# Patient Record
Sex: Female | Born: 1955 | Race: White | Hispanic: No | Smoking: Current every day smoker
Health system: Southern US, Community
[De-identification: ages and names within clinical notes are randomized; demographics above are authoritative.]

## PROBLEM LIST (undated history)

## (undated) DIAGNOSIS — R87623 High grade squamous intraepithelial lesion on cytologic smear of vagina (HGSIL): Secondary | ICD-10-CM

## (undated) DIAGNOSIS — G8929 Other chronic pain: Secondary | ICD-10-CM

## (undated) DIAGNOSIS — I10 Essential (primary) hypertension: Secondary | ICD-10-CM

## (undated) DIAGNOSIS — K219 Gastro-esophageal reflux disease without esophagitis: Secondary | ICD-10-CM

## (undated) DIAGNOSIS — F419 Anxiety disorder, unspecified: Secondary | ICD-10-CM

## (undated) DIAGNOSIS — M169 Osteoarthritis of hip, unspecified: Secondary | ICD-10-CM

## (undated) DIAGNOSIS — Z972 Presence of dental prosthetic device (complete) (partial): Secondary | ICD-10-CM

## (undated) DIAGNOSIS — M549 Dorsalgia, unspecified: Secondary | ICD-10-CM

## (undated) DIAGNOSIS — R002 Palpitations: Secondary | ICD-10-CM

---

## 1984-07-03 HISTORY — PX: CHOLECYSTECTOMY: SHX55

## 1998-05-31 ENCOUNTER — Ambulatory Visit (HOSPITAL_COMMUNITY): Admission: RE | Admit: 1998-05-31 | Discharge: 1998-05-31 | Payer: Self-pay | Admitting: Family Medicine

## 2000-06-08 ENCOUNTER — Ambulatory Visit (HOSPITAL_COMMUNITY): Admission: RE | Admit: 2000-06-08 | Discharge: 2000-06-08 | Payer: Self-pay | Admitting: Family Medicine

## 2000-06-08 ENCOUNTER — Encounter: Payer: Self-pay | Admitting: Family Medicine

## 2001-07-03 HISTORY — PX: FOOT SURGERY: SHX648

## 2002-12-10 ENCOUNTER — Other Ambulatory Visit: Admission: RE | Admit: 2002-12-10 | Discharge: 2002-12-10 | Payer: Self-pay | Admitting: Obstetrics and Gynecology

## 2003-10-22 ENCOUNTER — Ambulatory Visit (HOSPITAL_COMMUNITY): Admission: RE | Admit: 2003-10-22 | Discharge: 2003-10-22 | Payer: Self-pay | Admitting: Cardiology

## 2003-12-24 ENCOUNTER — Encounter: Admission: RE | Admit: 2003-12-24 | Discharge: 2003-12-24 | Payer: Self-pay | Admitting: Cardiology

## 2004-05-02 ENCOUNTER — Other Ambulatory Visit: Admission: RE | Admit: 2004-05-02 | Discharge: 2004-05-02 | Payer: Self-pay | Admitting: Obstetrics and Gynecology

## 2009-11-17 ENCOUNTER — Ambulatory Visit (HOSPITAL_COMMUNITY): Admission: RE | Admit: 2009-11-17 | Discharge: 2009-11-17 | Payer: Self-pay | Admitting: Obstetrics & Gynecology

## 2010-07-23 ENCOUNTER — Encounter: Payer: Self-pay | Admitting: Obstetrics and Gynecology

## 2010-07-24 ENCOUNTER — Encounter: Payer: Self-pay | Admitting: Cardiology

## 2015-04-02 ENCOUNTER — Other Ambulatory Visit: Payer: Self-pay | Admitting: Specialist

## 2015-04-02 DIAGNOSIS — Z1231 Encounter for screening mammogram for malignant neoplasm of breast: Secondary | ICD-10-CM

## 2015-07-14 ENCOUNTER — Other Ambulatory Visit: Payer: Self-pay | Admitting: Orthopedic Surgery

## 2015-07-14 DIAGNOSIS — M1611 Unilateral primary osteoarthritis, right hip: Secondary | ICD-10-CM

## 2015-07-16 ENCOUNTER — Ambulatory Visit
Admission: RE | Admit: 2015-07-16 | Discharge: 2015-07-16 | Disposition: A | Discharge status: Home / Self Care | Payer: No Typology Code available for payment source | Source: Ambulatory Visit | Attending: Orthopedic Surgery | Admitting: Orthopedic Surgery

## 2015-07-16 DIAGNOSIS — M1611 Unilateral primary osteoarthritis, right hip: Secondary | ICD-10-CM

## 2015-07-16 MED ORDER — IOHEXOL 180 MG/ML  SOLN
1.0000 mL | Freq: Once | INTRAMUSCULAR | Status: AC | PRN
Start: 2015-07-16 — End: 2015-07-16
  Administered 2015-07-16: 1 mL via INTRA_ARTICULAR

## 2015-07-16 MED ORDER — METHYLPREDNISOLONE ACETATE 40 MG/ML INJ SUSP (RADIOLOG
120.0000 mg | Freq: Once | INTRAMUSCULAR | Status: AC
  Administered 2015-07-16: 120 mg via INTRA_ARTICULAR

## 2015-07-21 ENCOUNTER — Ambulatory Visit
Admission: RE | Admit: 2015-07-21 | Discharge: 2015-07-21 | Disposition: A | Discharge status: Home / Self Care | Payer: No Typology Code available for payment source | Source: Ambulatory Visit | Attending: Specialist | Admitting: Specialist

## 2015-07-21 DIAGNOSIS — Z1231 Encounter for screening mammogram for malignant neoplasm of breast: Secondary | ICD-10-CM

## 2015-08-23 ENCOUNTER — Ambulatory Visit: Payer: Self-pay | Admitting: Podiatry

## 2016-01-06 ENCOUNTER — Other Ambulatory Visit: Payer: Self-pay | Admitting: Orthopedic Surgery

## 2016-01-06 DIAGNOSIS — M1611 Unilateral primary osteoarthritis, right hip: Secondary | ICD-10-CM

## 2016-01-17 ENCOUNTER — Ambulatory Visit
Admission: RE | Admit: 2016-01-17 | Discharge: 2016-01-17 | Disposition: A | Discharge status: Home / Self Care | Payer: No Typology Code available for payment source | Source: Ambulatory Visit | Attending: Orthopedic Surgery | Admitting: Orthopedic Surgery

## 2016-01-17 DIAGNOSIS — M1611 Unilateral primary osteoarthritis, right hip: Secondary | ICD-10-CM

## 2016-01-17 MED ORDER — METHYLPREDNISOLONE ACETATE 40 MG/ML INJ SUSP (RADIOLOG
120.0000 mg | Freq: Once | INTRAMUSCULAR | Status: AC
  Administered 2016-01-17: 120 mg via INTRA_ARTICULAR

## 2016-01-17 MED ORDER — IOPAMIDOL (ISOVUE-M 200) INJECTION 41%
1.0000 mL | Freq: Once | INTRAMUSCULAR | Status: AC
  Administered 2016-01-17: 1 mL via INTRA_ARTICULAR

## 2016-10-27 ENCOUNTER — Other Ambulatory Visit: Payer: Self-pay | Admitting: Obstetrics and Gynecology

## 2016-10-27 DIAGNOSIS — Z1231 Encounter for screening mammogram for malignant neoplasm of breast: Secondary | ICD-10-CM

## 2016-11-16 ENCOUNTER — Ambulatory Visit (HOSPITAL_COMMUNITY): Payer: Self-pay

## 2016-11-21 ENCOUNTER — Ambulatory Visit (HOSPITAL_COMMUNITY): Payer: Self-pay

## 2016-11-30 ENCOUNTER — Ambulatory Visit (HOSPITAL_COMMUNITY): Payer: Self-pay

## 2016-12-18 ENCOUNTER — Ambulatory Visit (INDEPENDENT_AMBULATORY_CARE_PROVIDER_SITE_OTHER): Payer: Self-pay | Admitting: Orthopaedic Surgery

## 2016-12-28 ENCOUNTER — Ambulatory Visit (INDEPENDENT_AMBULATORY_CARE_PROVIDER_SITE_OTHER): Payer: Self-pay | Admitting: Orthopaedic Surgery

## 2016-12-28 ENCOUNTER — Ambulatory Visit (INDEPENDENT_AMBULATORY_CARE_PROVIDER_SITE_OTHER): Payer: Self-pay

## 2016-12-28 DIAGNOSIS — M1611 Unilateral primary osteoarthritis, right hip: Secondary | ICD-10-CM

## 2016-12-28 DIAGNOSIS — M25551 Pain in right hip: Secondary | ICD-10-CM | POA: Insufficient documentation

## 2016-12-28 NOTE — Progress Notes (Signed)
Office Visit Note   Patient: Valerie MerinoJoiann Laszlo           Date of Birth: 1956-07-03           MRN: [id number] Visit Date: 12/28/2016              Requested by: No referring provider defined for this encounter. PCP: Patient, No Pcp Per   Assessment & Plan: Visit Diagnoses:  1. Pain in right hip   2. Unilateral primary osteoarthritis, right hip     Plan: At this point I do feel that she would benefit from a right direct anterior replacement. She understands this will be significantly difficult due to her size. There is significant risk of soft tissue issues and infection as well as malpositioning of the implants given her weight. However I do feel that the surgery can be done. She is not a diabetic and not a smoker. She's highly motivated. Her pain is so severe that I feel that there is not really other options other than hip replacement surgery. I spent a considerable amount of time showing her a hip model and explained in detail with the surgery involves including a thorough discussion of the risks and benefits of surgery. We will work on getting this set up for the near future. All questions were encouraged and answered.  Follow-Up Instructions: Return for 2 weeks post-op.   Orders:  No orders of the defined types were placed in this encounter.  No orders of the defined types were placed in this encounter.     Procedures: No procedures performed   Clinical Data: No additional findings.   Subjective: No chief complaint on file. The patient is someone that Dr. Carola FrostHandy is sent to me to evaluate severe arthritis of her right hip. I've actually talk to Dr. Carola FrostHandy personally about her. She has known severe arthritis of right hip. Her pain is daily and it is 10 out of 10. She is morbidly obese individual and does walk with a cane. She is tried and failed all forms of conservative treatment including rest, ice, heat, anti-inflammatories, activity modification, multiple steroid injections in  her right hip and attempts at weight loss. Her pain is daily and is detrimentally affected her activities daily living, her quality of life, and her mobility. She's been seeking treatment for this hip for well over a year to 2 years now. Is been getting significantly worse. She does wish to consider hip replacement surgery and is here to talk about this today. Her pain is mainly in the groin. It does affect her right knee some as well. She is not a smoker and not a diabetic.  HPI  Review of Systems  She currently denies any headache, chest pain, shorter breath, fever, chills, nausea, vomiting.  Objective: Vital Signs: There were no vitals taken for this visit.  Physical Exam She is alert or 3 in no acute distress but obvious discomfort and [place] is slowly with a cane. She has a hard time getting up on the exam table due to her pain and obesity. Ortho Exam Examination of her hip on the right side shows severe pain with any attempts of internal and external rotation. Does radiate down to the knee. It's mainly in the groin. I had her lay in the supine position only exam table to get a good idea of what her soft tissue envelope is right in her groin and her thigh to determine if I could do this surgery safely through direct  anterior approach. Specialty Comments:  No specialty comments available.  Imaging: No results found. X-rays that accompany her of her right hip and pelvis show severe end-stage arthritis of the right hip. There is complete loss of the joint space. The hip ball is flattening and there is particular osteophytes and sclerotic changes.  PMFS History: There are no active problems to display for this patient.  No past medical history on file.  No family history on file.  No past surgical history on file. Social History   Occupational History  . Not on file.   Social History Main Topics  . Smoking status: Not on file  . Smokeless tobacco: Not on file  . Alcohol use  Not on file  . Drug use: Unknown  . Sexual activity: Not on file

## 2017-01-04 ENCOUNTER — Ambulatory Visit (HOSPITAL_COMMUNITY): Payer: Self-pay

## 2017-01-22 NOTE — Progress Notes (Signed)
Please place orders in EPIC as patient is being scheduled for a pre-op appointment! Thank you! 

## 2017-02-16 ENCOUNTER — Inpatient Hospital Stay: Admit: 2017-02-16 | Payer: Self-pay | Admitting: Orthopaedic Surgery

## 2017-02-16 SURGERY — ARTHROPLASTY, HIP, TOTAL, ANTERIOR APPROACH
Anesthesia: Choice | Site: Hip | Laterality: Right

## 2017-04-02 ENCOUNTER — Encounter (HOSPITAL_COMMUNITY): Payer: Self-pay | Admitting: *Deleted

## 2017-04-02 NOTE — Progress Notes (Signed)
Advised patient if misses this [facility] appointment would no longer qualify for our program. Patient voiced understanding.

## 2017-05-03 ENCOUNTER — Ambulatory Visit (HOSPITAL_COMMUNITY): Payer: Self-pay

## 2017-06-21 ENCOUNTER — Ambulatory Visit (HOSPITAL_COMMUNITY): Payer: Self-pay

## 2017-08-20 ENCOUNTER — Ambulatory Visit (INDEPENDENT_AMBULATORY_CARE_PROVIDER_SITE_OTHER): Payer: Self-pay | Admitting: Orthopaedic Surgery

## 2017-08-27 ENCOUNTER — Ambulatory Visit (INDEPENDENT_AMBULATORY_CARE_PROVIDER_SITE_OTHER): Payer: Self-pay | Admitting: Orthopaedic Surgery

## 2017-09-05 ENCOUNTER — Ambulatory Visit (INDEPENDENT_AMBULATORY_CARE_PROVIDER_SITE_OTHER): Payer: Medicaid Other | Admitting: Orthopaedic Surgery

## 2017-09-05 ENCOUNTER — Encounter (INDEPENDENT_AMBULATORY_CARE_PROVIDER_SITE_OTHER): Payer: Self-pay | Admitting: Orthopaedic Surgery

## 2017-09-05 DIAGNOSIS — M1611 Unilateral primary osteoarthritis, right hip: Secondary | ICD-10-CM

## 2017-09-05 NOTE — Progress Notes (Signed)
The patient comes in today for continued discussions of her upcoming right total hip arthroplasty.  She is someone who is significantly morbidly obese and a smoker but not a diabetic.  She has known severe degenerative changes in her hip and we have her set up for surgery on March 29.  We had a long and thorough discussion about this and she understands that she is at high risk for wound complications and infection given her obesity.  All questions concerns were answered and addressed.  Her hip is limited mobility due to the severity of her pain on my exam.  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  All questions and concerns were answered and addressed.  We will see her at the time of surgery on March 29.

## 2017-09-10 ENCOUNTER — Other Ambulatory Visit (INDEPENDENT_AMBULATORY_CARE_PROVIDER_SITE_OTHER): Payer: Self-pay

## 2017-09-18 NOTE — Progress Notes (Signed)
Surgery on 3/29.  Preop on 3/22.  Need orders in epic

## 2017-09-19 ENCOUNTER — Other Ambulatory Visit (INDEPENDENT_AMBULATORY_CARE_PROVIDER_SITE_OTHER): Payer: Self-pay | Admitting: Physician Assistant

## 2017-09-20 ENCOUNTER — Encounter (HOSPITAL_COMMUNITY): Payer: Self-pay

## 2017-09-20 ENCOUNTER — Ambulatory Visit: Payer: Medicaid Other | Admitting: Podiatry

## 2017-09-21 ENCOUNTER — Inpatient Hospital Stay (HOSPITAL_COMMUNITY)
Admission: RE | Admit: 2017-09-21 | Discharge: 2017-09-21 | Disposition: A | Discharge status: Home / Self Care | Payer: Medicaid Other | Source: Ambulatory Visit

## 2017-09-21 HISTORY — DX: Osteoarthritis of hip, unspecified: M16.9

## 2017-09-21 HISTORY — DX: Morbid (severe) obesity due to excess calories: E66.01

## 2017-09-21 HISTORY — DX: Dorsalgia, unspecified: M54.9

## 2017-09-21 HISTORY — DX: Essential (primary) hypertension: I10

## 2017-09-21 HISTORY — DX: High grade squamous intraepithelial lesion on cytologic smear of vagina (HGSIL): R87.623

## 2017-09-21 HISTORY — DX: Other chronic pain: G89.29

## 2017-09-24 ENCOUNTER — Other Ambulatory Visit (HOSPITAL_COMMUNITY): Payer: Self-pay | Admitting: Emergency Medicine

## 2017-09-24 ENCOUNTER — Other Ambulatory Visit: Payer: Self-pay

## 2017-09-24 ENCOUNTER — Encounter (HOSPITAL_COMMUNITY)
Admission: RE | Admit: 2017-09-24 | Discharge: 2017-09-24 | Disposition: A | Discharge status: Home / Self Care | Payer: Medicaid Other | Source: Ambulatory Visit | Attending: Orthopaedic Surgery | Admitting: Orthopaedic Surgery

## 2017-09-24 ENCOUNTER — Encounter (HOSPITAL_COMMUNITY): Payer: Self-pay

## 2017-09-24 DIAGNOSIS — Z01810 Encounter for preprocedural cardiovascular examination: Secondary | ICD-10-CM | POA: Insufficient documentation

## 2017-09-24 DIAGNOSIS — I10 Essential (primary) hypertension: Secondary | ICD-10-CM | POA: Diagnosis not present

## 2017-09-24 DIAGNOSIS — M1611 Unilateral primary osteoarthritis, right hip: Secondary | ICD-10-CM | POA: Insufficient documentation

## 2017-09-24 DIAGNOSIS — Z01818 Encounter for other preprocedural examination: Secondary | ICD-10-CM | POA: Insufficient documentation

## 2017-09-24 HISTORY — DX: Anxiety disorder, unspecified: F41.9

## 2017-09-24 HISTORY — DX: Gastro-esophageal reflux disease without esophagitis: K21.9

## 2017-09-24 HISTORY — DX: Presence of dental prosthetic device (complete) (partial): Z97.2

## 2017-09-24 HISTORY — DX: Palpitations: R00.2

## 2017-09-24 LAB — BASIC METABOLIC PANEL
ANION GAP: 10 (ref 5–15)
BUN: 13 mg/dL (ref 6–20)
CHLORIDE: 101 mmol/L (ref 101–111)
CO2: 29 mmol/L (ref 22–32)
Calcium: 9.7 mg/dL (ref 8.9–10.3)
Creatinine, Ser: 0.73 mg/dL (ref 0.44–1.00)
GFR calc Af Amer: >60 mL/min (ref >60)
GFR calc non Af Amer: >60 mL/min (ref >60)
Glucose, Bld: 105 mg/dL — ABNORMAL HIGH (ref 65–99)
POTASSIUM: 4.2 mmol/L (ref 3.5–5.1)
Sodium: 140 mmol/L (ref 135–145)

## 2017-09-24 LAB — CBC
HEMATOCRIT: 42.8 % (ref 36.0–46.0)
HEMOGLOBIN: 14.5 g/dL (ref 12.0–15.0)
MCH: 29.5 pg (ref 26.0–34.0)
MCHC: 33.9 g/dL (ref 30.0–36.0)
MCV: 87.2 fL (ref 78.0–100.0)
Platelets: 226 10*3/uL (ref 150–400)
RBC: 4.91 MIL/uL (ref 3.87–5.11)
RDW: 13.2 % (ref 11.5–15.5)
WBC: 7 10*3/uL (ref 4.0–10.5)

## 2017-09-24 LAB — SURGICAL PCR SCREEN
MRSA, PCR: NEGATIVE
STAPHYLOCOCCUS AUREUS: NEGATIVE

## 2017-09-24 LAB — ABO/RH: ABO/RH(D): O POS

## 2017-09-24 NOTE — Patient Instructions (Addendum)
Valerie MerinoJoiann Barton  09/24/2017   Your procedure is scheduled on: 09-28-17    [facility]  [facility]    Report to admitting at 12:30PM    Call this number if you have problems the morning of surgery [phone]     Remember: Do not eat food After Midnight. You may have clear liquids from midnight until 9am day of surgery. Nothing by mouth after 9am!     Take these medicines the morning of surgery with A SIP OF WATER: hydrocodone if needed, nasal spray if needed                                You may not have any metal on your body including hair pins and              piercings  Do not wear jewelry, make-up, lotions, powders or perfumes, deodorant             Do not wear nail polish.  Do not shave  48 hours prior to surgery.                Do not bring valuables to the hospital. [facility] IS NOT             RESPONSIBLE   FOR VALUABLES.  Contacts, dentures or bridgework may not be worn into surgery.  Leave suitcase in the car. After surgery it may be brought to your room.                Please read over the following fact sheets you were given: _____________________________________________________________________             CLEAR LIQUID DIET   Foods Allowed                                                                     Foods Excluded  Coffee and tea, regular and decaf                             liquids that you cannot  Plain Jell-O in any flavor                                             see through such as: Fruit ices (not with fruit pulp)                                     milk, soups, orange juice  Iced Popsicles                                    All solid food Carbonated beverages, regular and diet  Cranberry, grape and apple juices Sports drinks like Gatorade Lightly seasoned clear broth or consume(fat free) Sugar, honey syrup  Sample Menu Breakfast                                Lunch                                      Supper Cranberry juice                    Beef broth                            Chicken broth Jell-O                                     Grape juice                           Apple juice Coffee or tea                        Jell-O                                      Popsicle                                                Coffee or tea                        Coffee or tea  _____________________________________________________________________  [facility] - Preparing for Surgery Before surgery, you can play an important role.  Because skin is not sterile, your skin needs to be as free of germs as possible.  You can reduce the number of germs on your skin by washing with CHG (chlorahexidine gluconate) soap before surgery.  CHG is an antiseptic cleaner which kills germs and bonds with the skin to continue killing germs even after washing. Please DO NOT use if you have an allergy to CHG or antibacterial soaps.  If your skin becomes reddened/irritated stop using the CHG and inform your nurse when you arrive at Short Stay. Do not shave (including legs and underarms) for at least 48 hours prior to the first CHG shower.  You may shave your face/neck. Please follow these instructions carefully:  1.  Shower with CHG Soap the night before surgery and the  morning of Surgery.  2.  If you choose to wash your hair, wash your hair first as usual with your  normal  shampoo.  3.  After you shampoo, rinse your hair and body thoroughly to remove the  shampoo.                           4.  Use CHG as you would any other liquid soap.  You can apply chg directly  to the skin and wash  Gently with a scrungie or clean washcloth.  5.  Apply the CHG Soap to your body ONLY FROM THE NECK DOWN.   Do not use on face/ open                           Wound or open sores. Avoid contact with eyes, ears mouth and genitals (private parts).                       Wash face,   Genitals (private parts) with your normal soap.             6.  Wash thoroughly, paying special attention to the area where your surgery  will be performed.  7.  Thoroughly rinse your body with warm water from the neck down.  8.  DO NOT shower/wash with your normal soap after using and rinsing off  the CHG Soap.                9.  Pat yourself dry with a clean towel.            10.  Wear clean pajamas.            11.  Place clean sheets on your bed the night of your first shower and do not  sleep with pets. Day of Surgery : Do not apply any lotions/deodorants the morning of surgery.  Please wear clean clothes to the hospital/surgery center.  FAILURE TO FOLLOW THESE INSTRUCTIONS MAY RESULT IN THE CANCELLATION OF YOUR SURGERY PATIENT SIGNATURE_________________________________  NURSE SIGNATURE__________________________________  ________________________________________________________________________   Adam Phenix  An incentive spirometer is a tool that can help keep your lungs clear and active. This tool measures how well you are filling your lungs with each breath. Taking long deep breaths may help reverse or decrease the chance of developing breathing (pulmonary) problems (especially infection) following:  A long period of time when you are unable to move or be active. BEFORE THE PROCEDURE   If the spirometer includes an indicator to show your best effort, your nurse or respiratory therapist will set it to a desired goal.  If possible, sit up straight or lean slightly forward. Try not to slouch.  Hold the incentive spirometer in an upright position. INSTRUCTIONS FOR USE  1. Sit on the edge of your bed if possible, or sit up as far as you can in bed or on a chair. 2. Hold the incentive spirometer in an upright position. 3. Breathe out normally. 4. Place the mouthpiece in your mouth and seal your lips tightly around it. 5. Breathe in slowly and as deeply as possible, raising  the piston or the ball toward the top of the column. 6. Hold your breath for 3-5 seconds or for as long as possible. Allow the piston or ball to fall to the bottom of the column. 7. Remove the mouthpiece from your mouth and breathe out normally. 8. Rest for a few seconds and repeat Steps 1 through 7 at least 10 times every 1-2 hours when you are awake. Take your time and take a few normal breaths between deep breaths. 9. The spirometer may include an indicator to show your best effort. Use the indicator as a goal to work toward during each repetition. 10. After each set of 10 deep breaths, practice coughing to be sure your lungs are clear. If you have an incision (the cut made at the time of  surgery), support your incision when coughing by placing a pillow or rolled up towels firmly against it. Once you are able to get out of bed, walk around indoors and cough well. You may stop using the incentive spirometer when instructed by your caregiver.  RISKS AND COMPLICATIONS  Take your time so you do not get dizzy or light-headed.  If you are in pain, you may need to take or ask for pain medication before doing incentive spirometry. It is harder to take a deep breath if you are having pain. AFTER USE  Rest and breathe slowly and easily.  It can be helpful to keep track of a log of your progress. Your caregiver can provide you with a simple table to help with this. If you are using the spirometer at home, follow these instructions: [place] IF:   You are having difficultly using the spirometer.  You have trouble using the spirometer as often as instructed.  Your pain medication is not giving enough relief while using the spirometer.  You develop fever of 100.5 F (38.1 C) or higher. SEEK IMMEDIATE MEDICAL CARE IF:   You cough up bloody sputum that had not been present before.  You develop fever of 102 F (38.9 C) or greater.  You develop worsening pain at or near the incision  site. MAKE SURE YOU:   Understand these instructions.  Will watch your condition.  Will get help right away if you are not doing well or get worse. Document Released: 10/30/2006 Document Revised: 09/11/2011 Document Reviewed: 12/31/2006 [facility] Patient Information 2014 [place], [place].   ________________________________________________________________________

## 2017-09-26 ENCOUNTER — Telehealth (INDEPENDENT_AMBULATORY_CARE_PROVIDER_SITE_OTHER): Payer: Self-pay | Admitting: Orthopaedic Surgery

## 2017-09-26 NOTE — Telephone Encounter (Signed)
Patient's daughter called on behalf of patient, states her mother is very ill, she's running a fever of 101, throwing up, etc. She would like to reschedule her surgery as soon as possible. And canceled the one scheduled for tomorrow. CB # [phone]

## 2017-09-27 ENCOUNTER — Telehealth (INDEPENDENT_AMBULATORY_CARE_PROVIDER_SITE_OTHER): Payer: Self-pay

## 2017-09-27 MED ORDER — HYDROCODONE-ACETAMINOPHEN 10-325 MG PO TABS: 1.0000 | ORAL_TABLET | Freq: Three times a day (TID) | ORAL | 0 refills | Status: DC | PRN

## 2017-09-27 MED ORDER — TRANEXAMIC ACID 1000 MG/10ML IV SOLN
1000.0000 mg | INTRAVENOUS | Status: AC
  Filled 2017-09-27: qty 10

## 2017-09-27 MED ORDER — DEXTROSE 5 % IV SOLN
3.0000 g | INTRAVENOUS | Status: AC
  Filled 2017-09-27: qty 3000

## 2017-09-27 NOTE — Telephone Encounter (Signed)
See below

## 2017-09-27 NOTE — Telephone Encounter (Signed)
Call her and tell her I sent him the hydrocodone to the [facility] on [address].  Also give her my best until her hope she feels better and will look forward to proceeding with her surgery later next month.

## 2017-09-27 NOTE — Telephone Encounter (Signed)
Patient is sick with a stomach virus/fever this week.  Postponing surgery until next available 10/26/17.  Dr. Carola FrostHandy has been prescribing medication Hydrocodone 10/325 and patient will run out on 09/29/17.  Will Dr. Magnus IvanBlackman prescribe pain medication until surgery can be done?

## 2017-09-27 NOTE — Telephone Encounter (Signed)
Patient aware Rx called in  

## 2017-09-28 LAB — TYPE AND SCREEN
ABO/RH(D): O POS
Antibody Screen: NEGATIVE

## 2017-09-28 NOTE — Telephone Encounter (Signed)
I called patient yesterday and rescheduled surgery.

## 2017-10-15 ENCOUNTER — Telehealth (INDEPENDENT_AMBULATORY_CARE_PROVIDER_SITE_OTHER): Payer: Self-pay | Admitting: Orthopaedic Surgery

## 2017-10-15 MED ORDER — HYDROCODONE-ACETAMINOPHEN 10-325 MG PO TABS: 1.0000 | ORAL_TABLET | Freq: Three times a day (TID) | ORAL | 0 refills | Status: DC | PRN

## 2017-10-15 NOTE — Telephone Encounter (Signed)
Patient aware she can pick up script at front desk

## 2017-10-15 NOTE — Telephone Encounter (Signed)
Please advise 

## 2017-10-15 NOTE — Telephone Encounter (Signed)
Patient called to request rx refill of Norco 10-325 mg. She said she will run out 4/17. She said it was sent in electronically last time but she can pick it up if she needs to. Patient CB # [phone]

## 2017-10-15 NOTE — Telephone Encounter (Signed)
Can come and pick up script 

## 2017-10-22 NOTE — Progress Notes (Signed)
Preop on 4/24.  Need orders in epic.

## 2017-10-23 ENCOUNTER — Other Ambulatory Visit (HOSPITAL_COMMUNITY): Payer: Self-pay | Admitting: Emergency Medicine

## 2017-10-23 ENCOUNTER — Other Ambulatory Visit (INDEPENDENT_AMBULATORY_CARE_PROVIDER_SITE_OTHER): Payer: Self-pay | Admitting: Physician Assistant

## 2017-10-23 NOTE — Patient Instructions (Addendum)
Marzetta MerinoJoiann Golliday  10/23/2017   Your procedure is scheduled on: 10-26-17   Report [facility]  Entr[facility]    Report to admitting at 12:15PM    Call this number if you have problems the morning of surgery [phone]     Remember: Do not eat food After Midnight. You may have clear liquids from midnight until 8:45am day of surgery. Nothing by mouth after 8:45am!     Take these medicines the morning of surgery with A SIP OF WATER: HYDROCODONE IF NEEDED                                You may not have any metal on your body including hair pins and              piercings  Do not wear jewelry, make-up, lotions, powders or perfumes, deodorant             Do not wear nail polish.  Do not shave  48 hours prior to surgery.               Do not bring valuables to the hospital. [facility] IS NOT             RESPONSIBLE   FOR VALUABLES.  Contacts, dentures or bridgework may not be worn into surgery.  Leave suitcase in the car. After surgery it may be brought to your room.                   Please read over the following fact sheets you were given: _____________________________________________________________________     CLEAR LIQUID DIET   Foods Allowed                                                                     Foods Excluded  Coffee and tea, regular and decaf                             liquids that you cannot  Plain Jell-O in any flavor                                             see through such as: Fruit ices (not with fruit pulp)                                     milk, soups, orange juice  Iced Popsicles                                    All solid food Carbonated beverages, regular and diet  Cranberry, grape and apple juices Sports drinks like Gatorade Lightly seasoned clear broth or consume(fat free) Sugar, honey syrup  Sample Menu Breakfast                                Lunch                                      Supper Cranberry juice                    Beef broth                            Chicken broth Jell-O                                     Grape juice                           Apple juice Coffee or tea                        Jell-O                                      Popsicle                                                Coffee or tea                        Coffee or tea  _____________________________________________________________________  [facility] - Preparing for Surgery Before surgery, you can play an important role.  Because skin is not sterile, your skin needs to be as free of germs as possible.  You can reduce the number of germs on your skin by washing with CHG (chlorahexidine gluconate) soap before surgery.  CHG is an antiseptic cleaner which kills germs and bonds with the skin to continue killing germs even after washing. Please DO NOT use if you have an allergy to CHG or antibacterial soaps.  If your skin becomes reddened/irritated stop using the CHG and inform your nurse when you arrive at Short Stay. Do not shave (including legs and underarms) for at least 48 hours prior to the first CHG shower.  You may shave your face/neck. Please follow these instructions carefully:  1.  Shower with CHG Soap the night before surgery and the  morning of Surgery.  2.  If you choose to wash your hair, wash your hair first as usual with your  normal  shampoo.  3.  After you shampoo, rinse your hair and body thoroughly to remove the  shampoo.                           4.  Use CHG as you would any other liquid soap.  You can apply chg directly  to the skin and wash  Gently with a scrungie or clean washcloth.  5.  Apply the CHG Soap to your body ONLY FROM THE NECK DOWN.   Do not use on face/ open                           Wound or open sores. Avoid contact with eyes, ears mouth and genitals (private parts).                       Wash face,  Genitals (private parts)  with your normal soap.             6.  Wash thoroughly, paying special attention to the area where your surgery  will be performed.  7.  Thoroughly rinse your body with warm water from the neck down.  8.  DO NOT shower/wash with your normal soap after using and rinsing off  the CHG Soap.                9.  Pat yourself dry with a clean towel.            10.  Wear clean pajamas.            11.  Place clean sheets on your bed the night of your first shower and do not  sleep with pets. Day of Surgery : Do not apply any lotions/deodorants the morning of surgery.  Please wear clean clothes to the hospital/surgery center.  FAILURE TO FOLLOW THESE INSTRUCTIONS MAY RESULT IN THE CANCELLATION OF YOUR SURGERY PATIENT SIGNATURE_________________________________  NURSE SIGNATURE__________________________________  ________________________________________________________________________   Adam Phenix  An incentive spirometer is a tool that can help keep your lungs clear and active. This tool measures how well you are filling your lungs with each breath. Taking long deep breaths may help reverse or decrease the chance of developing breathing (pulmonary) problems (especially infection) following:  A long period of time when you are unable to move or be active. BEFORE THE PROCEDURE   If the spirometer includes an indicator to show your best effort, your nurse or respiratory therapist will set it to a desired goal.  If possible, sit up straight or lean slightly forward. Try not to slouch.  Hold the incentive spirometer in an upright position. INSTRUCTIONS FOR USE  1. Sit on the edge of your bed if possible, or sit up as far as you can in bed or on a chair. 2. Hold the incentive spirometer in an upright position. 3. Breathe out normally. 4. Place the mouthpiece in your mouth and seal your lips tightly around it. 5. Breathe in slowly and as deeply as possible, raising the piston or the ball  toward the top of the column. 6. Hold your breath for 3-5 seconds or for as long as possible. Allow the piston or ball to fall to the bottom of the column. 7. Remove the mouthpiece from your mouth and breathe out normally. 8. Rest for a few seconds and repeat Steps 1 through 7 at least 10 times every 1-2 hours when you are awake. Take your time and take a few normal breaths between deep breaths. 9. The spirometer may include an indicator to show your best effort. Use the indicator as a goal to work toward during each repetition. 10. After each set of 10 deep breaths, practice coughing to be sure your lungs are clear. If you have an incision (the cut made at the time of  surgery), support your incision when coughing by placing a pillow or rolled up towels firmly against it. Once you are able to get out of bed, walk around indoors and cough well. You may stop using the incentive spirometer when instructed by your caregiver.  RISKS AND COMPLICATIONS  Take your time so you do not get dizzy or light-headed.  If you are in pain, you may need to take or ask for pain medication before doing incentive spirometry. It is harder to take a deep breath if you are having pain. AFTER USE  Rest and breathe slowly and easily.  It can be helpful to keep track of a log of your progress. Your caregiver can provide you with a simple table to help with this. If you are using the spirometer at home, follow these instructions: [place] IF:   You are having difficultly using the spirometer.  You have trouble using the spirometer as often as instructed.  Your pain medication is not giving enough relief while using the spirometer.  You develop fever of 100.5 F (38.1 C) or higher. SEEK IMMEDIATE MEDICAL CARE IF:   You cough up bloody sputum that had not been present before.  You develop fever of 102 F (38.9 C) or greater.  You develop worsening pain at or near the incision site. MAKE SURE YOU:    Understand these instructions.  Will watch your condition.  Will get help right away if you are not doing well or get worse. Document Released: 10/30/2006 Document Revised: 09/11/2011 Document Reviewed: 12/31/2006 [facility] Patient Information 2014 [place], [place].   ________________________________________________________________________

## 2017-10-23 NOTE — Progress Notes (Signed)
ekg 09-24-17 epic

## 2017-10-24 ENCOUNTER — Other Ambulatory Visit: Payer: Self-pay

## 2017-10-24 ENCOUNTER — Encounter (HOSPITAL_COMMUNITY)
Admission: RE | Admit: 2017-10-24 | Discharge: 2017-10-24 | Disposition: A | Discharge status: Home / Self Care | Payer: Medicaid Other | Source: Ambulatory Visit | Attending: Orthopaedic Surgery | Admitting: Orthopaedic Surgery

## 2017-10-24 ENCOUNTER — Encounter (HOSPITAL_COMMUNITY): Payer: Self-pay

## 2017-10-24 DIAGNOSIS — Z01812 Encounter for preprocedural laboratory examination: Secondary | ICD-10-CM | POA: Diagnosis not present

## 2017-10-24 DIAGNOSIS — M1611 Unilateral primary osteoarthritis, right hip: Secondary | ICD-10-CM | POA: Insufficient documentation

## 2017-10-24 LAB — SURGICAL PCR SCREEN
MRSA, PCR: NEGATIVE
Staphylococcus aureus: NEGATIVE

## 2017-10-24 LAB — BASIC METABOLIC PANEL
Anion gap: 10 (ref 5–15)
BUN: 13 mg/dL (ref 6–20)
CHLORIDE: 102 mmol/L (ref 101–111)
CO2: 27 mmol/L (ref 22–32)
Calcium: 9.4 mg/dL (ref 8.9–10.3)
Creatinine, Ser: 0.6 mg/dL (ref 0.44–1.00)
GFR calc Af Amer: >60 mL/min (ref >60)
GFR calc non Af Amer: >60 mL/min (ref >60)
GLUCOSE: 90 mg/dL (ref 65–99)
POTASSIUM: 3.9 mmol/L (ref 3.5–5.1)
Sodium: 139 mmol/L (ref 135–145)

## 2017-10-24 LAB — CBC
HCT: 40.7 % (ref 36.0–46.0)
Hemoglobin: 13.5 g/dL (ref 12.0–15.0)
MCH: 29 pg (ref 26.0–34.0)
MCHC: 33.2 g/dL (ref 30.0–36.0)
MCV: 87.3 fL (ref 78.0–100.0)
Platelets: 205 10*3/uL (ref 150–400)
RBC: 4.66 MIL/uL (ref 3.87–5.11)
RDW: 13.4 % (ref 11.5–15.5)
WBC: 5.5 10*3/uL (ref 4.0–10.5)

## 2017-10-25 MED ORDER — SODIUM CHLORIDE 0.9 % IV SOLN
1000.0000 mg | INTRAVENOUS | Status: AC
  Administered 2017-10-26: 1000 mg via INTRAVENOUS
  Filled 2017-10-25: qty 1100

## 2017-10-26 ENCOUNTER — Inpatient Hospital Stay (HOSPITAL_COMMUNITY): Payer: Medicaid Other | Admitting: Certified Registered"

## 2017-10-26 ENCOUNTER — Encounter (HOSPITAL_COMMUNITY)
Admission: RE | Disposition: A | Discharge status: Home Health | Payer: Self-pay | Source: Home / Self Care | Attending: Orthopaedic Surgery

## 2017-10-26 ENCOUNTER — Encounter (HOSPITAL_COMMUNITY): Payer: Self-pay | Admitting: Certified Registered"

## 2017-10-26 ENCOUNTER — Inpatient Hospital Stay (HOSPITAL_COMMUNITY)
Admission: RE | Admit: 2017-10-26 | Discharge: 2017-10-28 | DRG: 470 | Disposition: A | Discharge status: Home Health | Payer: Medicaid Other | Attending: Orthopaedic Surgery | Admitting: Orthopaedic Surgery

## 2017-10-26 ENCOUNTER — Inpatient Hospital Stay (HOSPITAL_COMMUNITY): Payer: Medicaid Other

## 2017-10-26 ENCOUNTER — Other Ambulatory Visit: Payer: Self-pay

## 2017-10-26 DIAGNOSIS — F17210 Nicotine dependence, cigarettes, uncomplicated: Secondary | ICD-10-CM | POA: Diagnosis present

## 2017-10-26 DIAGNOSIS — M1611 Unilateral primary osteoarthritis, right hip: Principal | ICD-10-CM | POA: Diagnosis present

## 2017-10-26 DIAGNOSIS — I10 Essential (primary) hypertension: Secondary | ICD-10-CM | POA: Diagnosis present

## 2017-10-26 DIAGNOSIS — K219 Gastro-esophageal reflux disease without esophagitis: Secondary | ICD-10-CM | POA: Diagnosis present

## 2017-10-26 DIAGNOSIS — Z419 Encounter for procedure for purposes other than remedying health state, unspecified: Secondary | ICD-10-CM

## 2017-10-26 DIAGNOSIS — Z885 Allergy status to narcotic agent status: Secondary | ICD-10-CM | POA: Diagnosis not present

## 2017-10-26 DIAGNOSIS — Z96641 Presence of right artificial hip joint: Secondary | ICD-10-CM

## 2017-10-26 DIAGNOSIS — Z6841 Body Mass Index (BMI) 40.0 and over, adult: Secondary | ICD-10-CM | POA: Diagnosis not present

## 2017-10-26 DIAGNOSIS — G8929 Other chronic pain: Secondary | ICD-10-CM | POA: Diagnosis present

## 2017-10-26 DIAGNOSIS — M25551 Pain in right hip: Secondary | ICD-10-CM | POA: Diagnosis present

## 2017-10-26 DIAGNOSIS — R002 Palpitations: Secondary | ICD-10-CM | POA: Diagnosis present

## 2017-10-26 DIAGNOSIS — M25751 Osteophyte, right hip: Secondary | ICD-10-CM | POA: Diagnosis present

## 2017-10-26 DIAGNOSIS — M25451 Effusion, right hip: Secondary | ICD-10-CM | POA: Diagnosis present

## 2017-10-26 DIAGNOSIS — F419 Anxiety disorder, unspecified: Secondary | ICD-10-CM | POA: Diagnosis present

## 2017-10-26 HISTORY — PX: TOTAL HIP ARTHROPLASTY: SHX124

## 2017-10-26 LAB — TYPE AND SCREEN
ABO/RH(D): O POS
Antibody Screen: NEGATIVE

## 2017-10-26 SURGERY — ARTHROPLASTY, HIP, TOTAL, ANTERIOR APPROACH
Anesthesia: Spinal | Site: Hip | Laterality: Right

## 2017-10-26 MED ORDER — HYDROMORPHONE HCL 1 MG/ML IJ SOLN
0.2500 mg | INTRAMUSCULAR | Status: DC | PRN
  Administered 2017-10-26 (×4): 0.5 mg via INTRAVENOUS

## 2017-10-26 MED ORDER — PHENOL 1.4 % MT LIQD
1.0000 | OROMUCOSAL | Status: DC | PRN
  Filled 2017-10-26: qty 177

## 2017-10-26 MED ORDER — ALUM & MAG HYDROXIDE-SIMETH 200-200-20 MG/5ML PO SUSP: 30.0000 mL | ORAL | Status: DC | PRN

## 2017-10-26 MED ORDER — ONDANSETRON HCL 4 MG/2ML IJ SOLN
INTRAMUSCULAR | Status: DC | PRN
  Administered 2017-10-26: 4 mg via INTRAVENOUS

## 2017-10-26 MED ORDER — PANTOPRAZOLE SODIUM 40 MG PO TBEC
40.0000 mg | DELAYED_RELEASE_TABLET | Freq: Every day | ORAL | Status: DC
  Administered 2017-10-26 – 2017-10-28 (×3): 40 mg via ORAL
  Filled 2017-10-26 (×3): qty 1

## 2017-10-26 MED ORDER — LACTATED RINGERS IV SOLN
INTRAVENOUS | Status: DC
  Administered 2017-10-26: 11:00:00 via INTRAVENOUS

## 2017-10-26 MED ORDER — PROPOFOL 10 MG/ML IV BOLUS
INTRAVENOUS | Status: AC
  Filled 2017-10-26: qty 40

## 2017-10-26 MED ORDER — FLUTICASONE PROPIONATE 50 MCG/ACT NA SUSP
1.0000 | Freq: Every day | NASAL | Status: DC | PRN
  Filled 2017-10-26: qty 16

## 2017-10-26 MED ORDER — DEXTROSE 5 % IV SOLN
3.0000 g | INTRAVENOUS | Status: AC
  Administered 2017-10-26: 3 g via INTRAVENOUS
  Filled 2017-10-26: qty 3

## 2017-10-26 MED ORDER — PROPOFOL 10 MG/ML IV BOLUS
INTRAVENOUS | Status: DC | PRN
  Administered 2017-10-26 (×3): 20 mg via INTRAVENOUS

## 2017-10-26 MED ORDER — SODIUM CHLORIDE 0.9 % IV SOLN
INTRAVENOUS | Status: DC
  Administered 2017-10-26 – 2017-10-27 (×2): via INTRAVENOUS

## 2017-10-26 MED ORDER — ONDANSETRON HCL 4 MG/2ML IJ SOLN: 4.0000 mg | Freq: Four times a day (QID) | INTRAMUSCULAR | Status: DC | PRN

## 2017-10-26 MED ORDER — GLYCOPYRROLATE 0.2 MG/ML IJ SOLN
INTRAMUSCULAR | Status: DC | PRN
  Administered 2017-10-26: 0.1 mg via INTRAVENOUS
  Administered 2017-10-26: 0.2 mg via INTRAVENOUS

## 2017-10-26 MED ORDER — PROPOFOL 500 MG/50ML IV EMUL
INTRAVENOUS | Status: DC | PRN
  Administered 2017-10-26: 25 ug/kg/min via INTRAVENOUS

## 2017-10-26 MED ORDER — ACETAMINOPHEN 325 MG PO TABS
325.0000 mg | ORAL_TABLET | Freq: Four times a day (QID) | ORAL | Status: DC | PRN
  Administered 2017-10-26 – 2017-10-28 (×4): 650 mg via ORAL
  Filled 2017-10-26 (×4): qty 2

## 2017-10-26 MED ORDER — BUPIVACAINE IN DEXTROSE 0.75-8.25 % IT SOLN
INTRATHECAL | Status: DC | PRN
  Administered 2017-10-26: 1.6 mL via INTRATHECAL

## 2017-10-26 MED ORDER — ONDANSETRON HCL 4 MG PO TABS: 4.0000 mg | ORAL_TABLET | Freq: Four times a day (QID) | ORAL | Status: DC | PRN

## 2017-10-26 MED ORDER — DEXMEDETOMIDINE HCL IN NACL 200 MCG/50ML IV SOLN
INTRAVENOUS | Status: DC | PRN
  Administered 2017-10-26: 4 ug via INTRAVENOUS
  Administered 2017-10-26: 2 ug via INTRAVENOUS
  Administered 2017-10-26 (×2): 4 ug via INTRAVENOUS

## 2017-10-26 MED ORDER — SODIUM CHLORIDE 0.9 % IR SOLN
Status: DC | PRN
  Administered 2017-10-26: 1000 mL

## 2017-10-26 MED ORDER — FENTANYL CITRATE (PF) 100 MCG/2ML IJ SOLN
INTRAMUSCULAR | Status: DC | PRN
  Administered 2017-10-26: 75 ug via INTRAVENOUS
  Administered 2017-10-26: 25 ug via INTRAVENOUS

## 2017-10-26 MED ORDER — CHLORHEXIDINE GLUCONATE 4 % EX LIQD: 60.0000 mL | Freq: Once | CUTANEOUS | Status: DC

## 2017-10-26 MED ORDER — HYDROCHLOROTHIAZIDE 25 MG PO TABS
25.0000 mg | ORAL_TABLET | Freq: Every day | ORAL | Status: DC
  Administered 2017-10-27 – 2017-10-28 (×2): 25 mg via ORAL
  Filled 2017-10-26 (×2): qty 1

## 2017-10-26 MED ORDER — EPHEDRINE SULFATE 50 MG/ML IJ SOLN
INTRAMUSCULAR | Status: DC | PRN
  Administered 2017-10-26 (×2): 10 mg via INTRAVENOUS
  Administered 2017-10-26: 5 mg via INTRAVENOUS

## 2017-10-26 MED ORDER — MIDAZOLAM HCL 2 MG/2ML IJ SOLN
INTRAMUSCULAR | Status: DC | PRN
  Administered 2017-10-26 (×2): 1 mg via INTRAVENOUS

## 2017-10-26 MED ORDER — DEXAMETHASONE SODIUM PHOSPHATE 10 MG/ML IJ SOLN
INTRAMUSCULAR | Status: DC | PRN
  Administered 2017-10-26: 10 mg via INTRAVENOUS

## 2017-10-26 MED ORDER — MENTHOL 3 MG MT LOZG: 1.0000 | LOZENGE | OROMUCOSAL | Status: DC | PRN

## 2017-10-26 MED ORDER — ASPIRIN 81 MG PO CHEW
81.0000 mg | CHEWABLE_TABLET | Freq: Two times a day (BID) | ORAL | Status: DC
  Administered 2017-10-26 – 2017-10-28 (×4): 81 mg via ORAL
  Filled 2017-10-26 (×4): qty 1

## 2017-10-26 MED ORDER — LACTATED RINGERS IV SOLN
INTRAVENOUS | Status: DC | PRN
  Administered 2017-10-26: 12:00:00 via INTRAVENOUS

## 2017-10-26 MED ORDER — METOCLOPRAMIDE HCL 5 MG/ML IJ SOLN: 5.0000 mg | Freq: Three times a day (TID) | INTRAMUSCULAR | Status: DC | PRN

## 2017-10-26 MED ORDER — METHOCARBAMOL 500 MG PO TABS
500.0000 mg | ORAL_TABLET | Freq: Four times a day (QID) | ORAL | Status: DC | PRN
  Administered 2017-10-27 – 2017-10-28 (×4): 500 mg via ORAL
  Filled 2017-10-26 (×5): qty 1

## 2017-10-26 MED ORDER — GABAPENTIN 100 MG PO CAPS
100.0000 mg | ORAL_CAPSULE | Freq: Three times a day (TID) | ORAL | Status: DC
  Administered 2017-10-26 – 2017-10-28 (×6): 100 mg via ORAL
  Filled 2017-10-26 (×6): qty 1

## 2017-10-26 MED ORDER — OXYCODONE HCL 5 MG PO TABS
10.0000 mg | ORAL_TABLET | ORAL | Status: DC | PRN
  Administered 2017-10-27 – 2017-10-28 (×3): 10 mg via ORAL
  Filled 2017-10-26: qty 3
  Filled 2017-10-26 (×2): qty 2

## 2017-10-26 MED ORDER — METOCLOPRAMIDE HCL 5 MG PO TABS: 5.0000 mg | ORAL_TABLET | Freq: Three times a day (TID) | ORAL | Status: DC | PRN

## 2017-10-26 MED ORDER — MIDAZOLAM HCL 2 MG/2ML IJ SOLN
INTRAMUSCULAR | Status: AC
  Filled 2017-10-26: qty 2

## 2017-10-26 MED ORDER — HYDROMORPHONE HCL 1 MG/ML IJ SOLN
INTRAMUSCULAR | Status: AC
  Administered 2017-10-26: 0.5 mg via INTRAVENOUS
  Filled 2017-10-26: qty 2

## 2017-10-26 MED ORDER — CEFAZOLIN SODIUM-DEXTROSE 2-4 GM/100ML-% IV SOLN
2.0000 g | Freq: Four times a day (QID) | INTRAVENOUS | Status: AC
  Administered 2017-10-26 – 2017-10-27 (×2): 2 g via INTRAVENOUS
  Filled 2017-10-26 (×2): qty 100

## 2017-10-26 MED ORDER — PHENYLEPHRINE HCL 10 MG/ML IJ SOLN
INTRAMUSCULAR | Status: DC | PRN
Start: 2017-10-26 — End: 2017-10-26
  Administered 2017-10-26 (×2): 100 ug via INTRAVENOUS

## 2017-10-26 MED ORDER — DIPHENHYDRAMINE HCL 12.5 MG/5ML PO ELIX: 12.5000 mg | ORAL_SOLUTION | ORAL | Status: DC | PRN

## 2017-10-26 MED ORDER — MEPERIDINE HCL 50 MG/ML IJ SOLN: 6.2500 mg | INTRAMUSCULAR | Status: DC | PRN

## 2017-10-26 MED ORDER — PROPOFOL 10 MG/ML IV BOLUS
INTRAVENOUS | Status: AC
  Filled 2017-10-26: qty 60

## 2017-10-26 MED ORDER — FENTANYL CITRATE (PF) 100 MCG/2ML IJ SOLN
INTRAMUSCULAR | Status: AC
  Filled 2017-10-26: qty 2

## 2017-10-26 MED ORDER — METOCLOPRAMIDE HCL 5 MG/ML IJ SOLN: 10.0000 mg | Freq: Once | INTRAMUSCULAR | Status: DC | PRN

## 2017-10-26 MED ORDER — METHOCARBAMOL 1000 MG/10ML IJ SOLN
500.0000 mg | Freq: Four times a day (QID) | INTRAVENOUS | Status: DC | PRN
  Administered 2017-10-26: 500 mg via INTRAVENOUS
  Filled 2017-10-26: qty 550

## 2017-10-26 MED ORDER — POLYETHYLENE GLYCOL 3350 17 G PO PACK: 17.0000 g | PACK | Freq: Every day | ORAL | Status: DC | PRN

## 2017-10-26 MED ORDER — HYDROMORPHONE HCL 1 MG/ML IJ SOLN
0.5000 mg | INTRAMUSCULAR | Status: DC | PRN
  Administered 2017-10-26 – 2017-10-27 (×2): 0.5 mg via INTRAVENOUS
  Filled 2017-10-26 (×2): qty 1

## 2017-10-26 MED ORDER — OXYCODONE HCL 5 MG PO TABS
5.0000 mg | ORAL_TABLET | ORAL | Status: DC | PRN
  Administered 2017-10-26: 5 mg via ORAL
  Administered 2017-10-27 (×3): 10 mg via ORAL
  Administered 2017-10-28: 5 mg via ORAL
  Filled 2017-10-26: qty 2
  Filled 2017-10-26: qty 1
  Filled 2017-10-26 (×2): qty 2
  Filled 2017-10-26: qty 1
  Filled 2017-10-26: qty 2

## 2017-10-26 MED ORDER — PROPOFOL 10 MG/ML IV BOLUS
INTRAVENOUS | Status: AC
  Filled 2017-10-26: qty 20

## 2017-10-26 MED ORDER — STERILE WATER FOR IRRIGATION IR SOLN
Status: DC | PRN
  Administered 2017-10-26: 2000 mL

## 2017-10-26 MED ORDER — DOCUSATE SODIUM 100 MG PO CAPS
100.0000 mg | ORAL_CAPSULE | Freq: Two times a day (BID) | ORAL | Status: DC
  Administered 2017-10-26 – 2017-10-28 (×4): 100 mg via ORAL
  Filled 2017-10-26 (×4): qty 1

## 2017-10-26 SURGICAL SUPPLY — 33 items
BAG ZIPLOCK 12X15 (MISCELLANEOUS) IMPLANT
BENZOIN TINCTURE PRP APPL 2/3 (GAUZE/BANDAGES/DRESSINGS) IMPLANT
BLADE SAW SGTL 18X1.27X75 (BLADE) ×2 IMPLANT
CAPT HIP TOTAL 2 ×2 IMPLANT
COVER PERINEAL POST (MISCELLANEOUS) ×2 IMPLANT
COVER SURGICAL LIGHT HANDLE (MISCELLANEOUS) ×2 IMPLANT
DRAPE STERI IOBAN 125X83 (DRAPES) ×2 IMPLANT
DRAPE U-SHAPE 47X51 STRL (DRAPES) ×4 IMPLANT
DRSG AQUACEL AG ADV 3.5X10 (GAUZE/BANDAGES/DRESSINGS) ×2 IMPLANT
DRSG XEROFORM 1X8 (GAUZE/BANDAGES/DRESSINGS) ×2 IMPLANT
DURAPREP 26ML APPLICATOR (WOUND CARE) ×2 IMPLANT
ELECT REM PT RETURN 15FT ADLT (MISCELLANEOUS) ×2 IMPLANT
GAUZE XEROFORM 1X8 LF (GAUZE/BANDAGES/DRESSINGS) IMPLANT
GLOVE BIO SURGEON STRL SZ7.5 (GLOVE) ×2 IMPLANT
GLOVE BIOGEL PI IND STRL 8 (GLOVE) ×2 IMPLANT
GLOVE BIOGEL PI INDICATOR 8 (GLOVE) ×2
GLOVE ECLIPSE 8.0 STRL XLNG CF (GLOVE) ×2 IMPLANT
GOWN STRL REUS W/TWL XL LVL3 (GOWN DISPOSABLE) ×8 IMPLANT
HANDPIECE INTERPULSE COAX TIP (DISPOSABLE) ×1
HOLDER FOLEY CATH W/STRAP (MISCELLANEOUS) ×2 IMPLANT
PACK ANTERIOR HIP CUSTOM (KITS) ×2 IMPLANT
SET HNDPC FAN SPRY TIP SCT (DISPOSABLE) ×1 IMPLANT
STAPLER VISISTAT 35W (STAPLE) IMPLANT
STRIP CLOSURE SKIN 1/2X4 (GAUZE/BANDAGES/DRESSINGS) IMPLANT
SUT ETHIBOND NAB CT1 #1 30IN (SUTURE) ×2 IMPLANT
SUT ETHILON 2 0 PS N (SUTURE) ×6 IMPLANT
SUT MNCRL AB 4-0 PS2 18 (SUTURE) IMPLANT
SUT VIC AB 0 CT1 36 (SUTURE) ×2 IMPLANT
SUT VIC AB 1 CT1 36 (SUTURE) ×2 IMPLANT
SUT VIC AB 2-0 CT1 27 (SUTURE) ×2
SUT VIC AB 2-0 CT1 TAPERPNT 27 (SUTURE) ×2 IMPLANT
TRAY FOLEY W/METER SILVER 16FR (SET/KITS/TRAYS/PACK) ×2 IMPLANT
YANKAUER SUCT BULB TIP 10FT TU (MISCELLANEOUS) ×2 IMPLANT

## 2017-10-26 NOTE — H&P (Signed)
TOTAL HIP ADMISSION H&P  Patient is admitted for right total hip arthroplasty.  Subjective:  Chief Complaint: right hip pain  HPI: Valerie Barton, 62 y.o. female, has a history of pain and functional disability in the right hip(s) due to arthritis and patient has failed non-surgical conservative treatments for greater than 12 weeks to include NSAID's and/or analgesics, flexibility and strengthening excercises, use of assistive devices, weight reduction as appropriate and activity modification.  Onset of symptoms was gradual starting 4 years ago with gradually worsening course since that time.The patient noted no past surgery on the right hip(s).  Patient currently rates pain in the right hip at 10 out of 10 with activity. Patient has night pain, worsening of pain with activity and weight bearing, trendelenberg gait, pain that interfers with activities of daily living and pain with passive range of motion. Patient has evidence of subchondral cysts, subchondral sclerosis, periarticular osteophytes and joint space narrowing by imaging studies. This condition presents safety issues increasing the risk of falls.  There is no current active infection.  Patient Active Problem List   Diagnosis Date Noted  . Unilateral primary osteoarthritis, right hip 12/28/2016  . Pain in right hip 12/28/2016   Past Medical History:  Diagnosis Date  . Anxiety    some anxiety attacks 20 years ago   . Chronic back pain   . GERD (gastroesophageal reflux disease)    on occ   . Heart palpitations    patietn states this is a famailial trait ; palpitations are occassional , more frequent with caffeine or if taking psudafed   . HGSIL Pap smear of vagina   . Hypertension   . Morbid obesity (HCC)   . OA (osteoarthritis) of hip    Right  . Wears dentures     Past Surgical History:  Procedure Laterality Date  . CHOLECYSTECTOMY  07/1984  . FOOT SURGERY Bilateral 2003   may have been bone spurs on small toe      Current Facility-Administered Medications  Medication Dose Route Frequency Provider Last Rate Last Dose  . ceFAZolin (ANCEF) 3 g in dextrose 5 % 50 mL IVPB  3 g Intravenous On Call to OR Kathryne Hitch, MD      . chlorhexidine (HIBICLENS) 4 % liquid 4 application  60 mL Topical Once Kirtland Bouchard, PA-C      . lactated ringers infusion   Intravenous Continuous Mal Amabile, MD 50 mL/hr at 10/26/17 1128    . tranexamic acid (CYKLOKAPRON) 1,000 mg in sodium chloride 0.9 % 100 mL IVPB  1,000 mg Intravenous To OR Kathryne Hitch, MD       Allergies  Allergen Reactions  . Other Other (See Comments)    Patient states percocet, dilaudid, or morphine=gallbladder attacks symptoms.    Social History   Tobacco Use  . Smoking status: Current Every Day Smoker    Years: 4.00    Types: Cigarettes  . Smokeless tobacco: Never Used  Substance Use Topics  . Alcohol use: Not Currently    History reviewed. No pertinent family history.   Review of Systems  Musculoskeletal: Positive for back pain and joint pain.  All other systems reviewed and are negative.   Objective:  Physical Exam  Constitutional: She is oriented to person, place, and time. She appears well-developed and well-nourished.  HENT:  Head: Normocephalic and atraumatic.  Eyes: Pupils are equal, round, and reactive to light. EOM are normal.  Neck: Normal range of motion. Neck supple.  Cardiovascular: Normal rate and regular rhythm.  Respiratory: Effort normal and breath sounds normal.  GI: Soft. Bowel sounds are normal.  Musculoskeletal:       Right hip: She exhibits decreased range of motion, decreased strength, tenderness and bony tenderness.  Neurological: She is alert and oriented to person, place, and time.  Skin: Skin is warm and dry.  Psychiatric: She has a normal mood and affect.    Vital signs in last 24 hours: Temp:  [97.9 F (36.6 C)] 97.9 F (36.6 C) (04/26 1056) Pulse Rate:  [79] 79 (04/26  1056) Resp:  [16] 16 (04/26 1056) BP: (159)/(87) 159/87 (04/26 1056) SpO2:  [100 %] 100 % (04/26 1056) Weight:  [291 lb (132 kg)] 291 lb (132 kg) (04/26 1128)  Labs:   Estimated body mass index is 51.55 kg/m as calculated from the following:   Height as of this encounter: 5\' 3"  (1.6 m).   Weight as of this encounter: 291 lb (132 kg).   Imaging Review Plain radiographs demonstrate severe degenerative joint disease of the right hip(s). The bone quality appears to be good for age and reported activity level.    Preoperative templating of the joint replacement has been completed, documented, and submitted to the Operating Room personnel in order to optimize intra-operative equipment management.   Anticipated LOS equal to or greater than 2 midnights due to - Age 60 and older with one or more of the following:  - Obesity  - Expected need for hospital services (PT, OT, Nursing) required for safe  discharge  - Anticipated need for postoperative skilled nursing care or inpatient rehab  - Active co-morbidities: Chronic pain requiring opiods OR   - Unanticipated findings during/Post Surgery: Slow post-op progression: GI, pain control, mobility  - Patient is a high risk of re-admission due to: None     Assessment/Plan:  End stage arthritis, right hip(s)  The patient history, physical examination, clinical judgement of the provider and imaging studies are consistent with end stage degenerative joint disease of the right hip(s) and total hip arthroplasty is deemed medically necessary. The treatment options including medical management, injection therapy, arthroscopy and arthroplasty were discussed at length. The risks and benefits of total hip arthroplasty were presented and reviewed. The risks due to aseptic loosening, infection, stiffness, dislocation/subluxation,  thromboembolic complications and other imponderables were discussed.  The patient acknowledged the explanation, agreed to  proceed with the plan and consent was signed. Patient is being admitted for inpatient treatment for surgery, pain control, PT, OT, prophylactic antibiotics, VTE prophylaxis, progressive ambulation and ADL's and discharge planning.The patient is planning to be discharged home with home health services

## 2017-10-26 NOTE — Brief Op Note (Signed)
10/26/2017  1:55 PM  PATIENT:  Valerie Barton  62 y.o. female  PRE-OPERATIVE DIAGNOSIS:  severe osteoarthritis right hip  POST-OPERATIVE DIAGNOSIS:  severe osteoarthritis right hip  PROCEDURE:  Procedure(s): RIGHT TOTAL HIP ARTHROPLASTY ANTERIOR APPROACH (Right)  SURGEON:  Surgeon(s) and Role:    Kathryne Hitch, MD - Primary  PHYSICIAN ASSISTANT: Rexene Edison, PA-C  ANESTHESIA:   spinal  EBL:  300 mL   COUNTS:  YES  DICTATION: .Other Dictation: Dictation Number [id number]  PLAN OF CARE: Admit to inpatient   PATIENT DISPOSITION:  PACU - hemodynamically stable.   Delay start of Pharmacological VTE agent (>24hrs) due to surgical blood loss or risk of bleeding: no

## 2017-10-26 NOTE — Anesthesia Preprocedure Evaluation (Addendum)
Anesthesia Evaluation  Patient identified by MRN, date of birth, ID band Patient awake    Reviewed: Allergy & Precautions, NPO status , Patient's Chart, lab work & pertinent test results, reviewed documented beta blocker date and time   Airway Mallampati: II  TM Distance: >3 FB Neck ROM: Full    Dental  (+) Lower Dentures, Upper Dentures   Pulmonary Current Smoker,    Pulmonary exam normal breath sounds clear to auscultation       Cardiovascular hypertension, Pt. on medications Normal cardiovascular exam Rhythm:Regular Rate:Normal     Neuro/Psych Anxiety negative neurological ROS     GI/Hepatic Neg liver ROS, GERD  Medicated and Controlled,  Endo/Other  Morbid obesity  Renal/GU negative Renal ROS  negative genitourinary   Musculoskeletal  (+) Arthritis , Osteoarthritis,  OA right hip   Abdominal (+) + obese,   Peds  Hematology negative hematology ROS (+)   Anesthesia Other Findings   Reproductive/Obstetrics                             Anesthesia Physical Anesthesia Plan  ASA: III  Anesthesia Plan: Spinal   Post-op Pain Management:    Induction:   PONV Risk Score and Plan: 2 and Midazolam, Propofol infusion, Ondansetron and Treatment may vary due to age or medical condition  Airway Management Planned: Natural Airway  Additional Equipment:   Intra-op Plan:   Post-operative Plan:   Informed Consent: I have reviewed the patients History and Physical, chart, labs and discussed the procedure including the risks, benefits and alternatives for the proposed anesthesia with the patient or authorized representative who has indicated his/her understanding and acceptance.     Plan Discussed with: Anesthesiologist, CRNA and Surgeon  Anesthesia Plan Comments:         Anesthesia Quick Evaluation

## 2017-10-26 NOTE — Plan of Care (Signed)
Plan of care 

## 2017-10-26 NOTE — Anesthesia Procedure Notes (Signed)
Spinal  Patient location during procedure: OR Start time: 10/26/2017 12:16 PM Staffing Anesthesiologist: Mal AmabileFoster, Kerney Hopfensperger, MD Performed: anesthesiologist  Preanesthetic Checklist Completed: patient identified, site marked, surgical consent, pre-op evaluation, timeout performed, IV checked, risks and benefits discussed and monitors and equipment checked Spinal Block Patient position: sitting Prep: site prepped and draped and DuraPrep Patient monitoring: heart rate, cardiac monitor, continuous pulse ox and blood pressure Approach: midline Location: L3-4 Injection technique: single-shot Needle Needle type: Pencan  Needle gauge: 24 G Needle length: 9 cm Needle insertion depth: 8 cm Assessment Sensory level: T4 Additional Notes Patient tolerated procedure well. Adequate sensory block.

## 2017-10-26 NOTE — Anesthesia Postprocedure Evaluation (Signed)
Anesthesia Post Note  Patient: Valerie Barton  Procedure(s) Performed: RIGHT TOTAL HIP ARTHROPLASTY ANTERIOR APPROACH (Right Hip)     Patient location during evaluation: PACU Anesthesia Type: Spinal Level of consciousness: oriented and awake and alert Pain management: pain level controlled Vital Signs Assessment: post-procedure vital signs reviewed and stable Respiratory status: spontaneous breathing, respiratory function stable and nonlabored ventilation Cardiovascular status: blood pressure returned to baseline and stable Postop Assessment: no headache, no backache, no apparent nausea or vomiting, spinal receding and patient able to bend at knees Anesthetic complications: no    Last Vitals:  Vitals:   10/26/17 1500 10/26/17 1515  BP: 123/70 116/73  Pulse: 62 62  Resp: 16 12  Temp:  36.5 C  SpO2: 100% 100%    Last Pain:  Vitals:   10/26/17 1515  PainSc: 4                  Marjoria Mancillas A.

## 2017-10-26 NOTE — Transfer of Care (Signed)
Immediate Anesthesia Transfer of Care Note  Patient: Valerie Barton  Procedure(s) Performed: RIGHT TOTAL HIP ARTHROPLASTY ANTERIOR APPROACH (Right Hip)  Patient Location: PACU  Anesthesia Type:MAC and Spinal  Level of Consciousness: awake, alert , oriented and patient cooperative  Airway & Oxygen Therapy: Patient Spontanous Breathing and Patient connected to face mask oxygen  Post-op Assessment: Report given to RN and Post -op Vital signs reviewed and stable  Post vital signs: Reviewed and stable  Last Vitals:  Vitals Value Taken Time  BP 101/55 10/26/2017  2:24 PM  Temp    Pulse 80 10/26/2017  2:25 PM  Resp 18 10/26/2017  2:25 PM  SpO2 100 % 10/26/2017  2:25 PM  Vitals shown include unvalidated device data.  Last Pain:  Vitals:   10/26/17 1121  PainSc: 6          Complications: No apparent anesthesia complications

## 2017-10-27 LAB — CBC
HEMATOCRIT: 34.8 % — AB (ref 36.0–46.0)
Hemoglobin: 11.3 g/dL — ABNORMAL LOW (ref 12.0–15.0)
MCH: 28.5 pg (ref 26.0–34.0)
MCHC: 32.5 g/dL (ref 30.0–36.0)
MCV: 87.7 fL (ref 78.0–100.0)
PLATELETS: 169 10*3/uL (ref 150–400)
RBC: 3.97 MIL/uL (ref 3.87–5.11)
RDW: 13.4 % (ref 11.5–15.5)
WBC: 9.2 10*3/uL (ref 4.0–10.5)

## 2017-10-27 LAB — BASIC METABOLIC PANEL
ANION GAP: 11 (ref 5–15)
BUN: 12 mg/dL (ref 6–20)
CO2: 26 mmol/L (ref 22–32)
Calcium: 9.1 mg/dL (ref 8.9–10.3)
Chloride: 102 mmol/L (ref 101–111)
Creatinine, Ser: 0.69 mg/dL (ref 0.44–1.00)
GLUCOSE: 141 mg/dL — AB (ref 65–99)
POTASSIUM: 4.2 mmol/L (ref 3.5–5.1)
Sodium: 139 mmol/L (ref 135–145)

## 2017-10-27 MED ORDER — ASPIRIN 81 MG PO CHEW: 81.0000 mg | CHEWABLE_TABLET | Freq: Two times a day (BID) | ORAL | 0 refills | Status: AC

## 2017-10-27 MED ORDER — DOXYCYCLINE HYCLATE 100 MG PO TABS: 100.0000 mg | ORAL_TABLET | Freq: Two times a day (BID) | ORAL | 0 refills | Status: DC

## 2017-10-27 MED ORDER — METHOCARBAMOL 500 MG PO TABS: 500.0000 mg | ORAL_TABLET | Freq: Four times a day (QID) | ORAL | 0 refills | Status: AC | PRN

## 2017-10-27 MED ORDER — OXYCODONE HCL 5 MG PO TABS: 5.0000 mg | ORAL_TABLET | ORAL | 0 refills | Status: DC | PRN

## 2017-10-27 NOTE — Evaluation (Signed)
Occupational Therapy Evaluation Patient Details Name: Valerie Barton MRN: [id number] DOB: [birth year] Today's Date: 10/27/2017    History of Present Illness Pt s/p R THR    Clinical Impression   Pt admitted for THR. Pt currently with functional limitations due to the deficits listed below (see OT Problem List).  Pt will benefit from skilled OT to increase their safety and independence with ADL and functional mobility for ADL to facilitate discharge to venue listed below.      Follow Up Recommendations  No OT follow up    Equipment Recommendations  None recommended by OT    Recommendations for Other Services       Precautions / Restrictions Precautions Precautions: Fall Restrictions Weight Bearing Restrictions: No Other Position/Activity Restrictions: WBAT      Mobility Bed Mobility Overal bed mobility: Needs Assistance Bed Mobility: Supine to Sit     Supine to sit: Min assist     General bed mobility comments: pt up in room with out A  Transfers Overall transfer level: Needs assistance Equipment used: Rolling walker (2 wheeled) Transfers: Sit to/from Stand Sit to Stand: Min guard         General transfer comment: VC for safety        ADL either performed or assessed with clinical judgement   ADL Overall ADL's : Needs assistance/impaired Eating/Feeding: Set up;Sitting   Grooming: Min guard;Standing   Upper Body Bathing: Set up;Sitting   Lower Body Bathing: Moderate assistance;Sit to/from stand;Cueing for sequencing;Cueing for safety   Upper Body Dressing : Set up;Sitting   Lower Body Dressing: Moderate assistance;Sit to/from stand;Cueing for sequencing;Cueing for safety   Toilet Transfer: Minimal assistance;RW;Comfort height toilet;Ambulation   Toileting- Clothing Manipulation and Hygiene: Minimal assistance;Sit to/from stand;Cueing for safety         General ADL Comments: pt up in room with walker trying to get around botto, up the bed.  chair alarm going off loudly.  Explained safety concerns of situation and pt stated she though she was she was suppose to be up and about.  Explained that pt needed to call for A     Vision Patient Visual Report: No change from baseline              Pertinent Vitals/Pain Pain Assessment: 0-10 Pain Score: 3  Pain Location: R hip/thigh Pain Descriptors / Indicators: Aching;Sore Pain Intervention(s): Limited activity within patient's tolerance;Monitored during session     Hand Dominance     Extremity/Trunk Assessment Upper Extremity Assessment Upper Extremity Assessment: Overall WFL for tasks assessed   Lower Extremity Assessment Lower Extremity Assessment: RLE deficits/detail RLE Deficits / Details: AAROM at hip to 80 flex and 15 abd with strength at hip 2+/5       Communication Communication Communication: No difficulties   Cognition Arousal/Alertness: Awake/alert Behavior During Therapy: WFL for tasks assessed/performed Overall Cognitive Status: Within Functional Limits for tasks assessed                                                Home Living Family/patient expects to be discharged to:: Private residence Living Arrangements: Children Available Help at Discharge: Family Type of Home: House Home Access: Level entry     Home Layout: One level               Home Equipment: Environmental consultant - 4 wheels  Prior Functioning/Environment Level of Independence: Independent;Independent with assistive device(s)        Comments: using cane and rollator as needed        OT Problem List: Decreased strength;Decreased activity tolerance;Decreased safety awareness;Obesity;Other (comment)(decreased safety awareness)      OT Treatment/Interventions: Self-care/ADL training;Patient/family education;DME and/or AE instruction;Therapeutic activities    OT Goals(Current goals can be found in the care plan section) Acute Rehab OT Goals Patient Stated  Goal: Regain IND OT Goal Formulation: With patient Time For Goal Achievement: 11/03/17  OT Frequency: Min 2X/week   Barriers to D/C:               AM-PAC PT "6 Clicks" Daily Activity     Outcome Measure Help from another person eating meals?: None Help from another person taking care of personal grooming?: A Little Help from another person toileting, which includes using toliet, bedpan, or urinal?: A Little Help from another person bathing (including washing, rinsing, drying)?: A Little Help from another person to put on and taking off regular upper body clothing?: None Help from another person to put on and taking off regular lower body clothing?: A Little 6 Click Score: 20   End of Session Equipment Utilized During Treatment: Rolling walker Nurse Communication: Mobility status  Activity Tolerance: Patient tolerated treatment well Patient left: in chair  OT Visit Diagnosis: Unsteadiness on feet (R26.81);Other abnormalities of gait and mobility (R26.89)                Time: 1610-9604 OT Time Calculation (min): 15 min Charges:  OT General Charges $OT Visit: 1 Visit OT Evaluation $OT Eval Moderate Complexity: 1 Mod G-Codes:     [name], [place] [phone]  Einar Crow D 10/27/2017, 2:31 PM

## 2017-10-27 NOTE — Evaluation (Signed)
Physical Therapy Evaluation Patient Details Name: Valerie Barton MRN: [id number] DOB: [birth year] Today's Date: 10/27/2017   History of Present Illness  Pt s/p R THR   Clinical Impression  Pt s/p R THR and presents with decreased R LE strength/ROM, obesity and post op pain limiting functional mobility.  Pt should progress to dc home with family assist.    Follow Up Recommendations Follow surgeon's recommendation for DC plan and follow-up therapies    Equipment Recommendations       Recommendations for Other Services OT consult     Precautions / Restrictions Precautions Precautions: Fall Restrictions Weight Bearing Restrictions: No Other Position/Activity Restrictions: WBAT      Mobility  Bed Mobility Overal bed mobility: Needs Assistance Bed Mobility: Supine to Sit     Supine to sit: Min assist     General bed mobility comments: cues for sequence and use of L LE to self assist  Transfers Overall transfer level: Needs assistance   Transfers: Sit to/from Stand Sit to Stand: Min guard         General transfer comment: cues for LE management and use of UEs to self assist  Ambulation/Gait Ambulation/Gait assistance: Min assist;Min guard Ambulation Distance (Feet): 200 Feet Assistive device: Rolling walker (2 wheeled) Gait Pattern/deviations: Step-to pattern;Step-through pattern;Decreased step length - right;Decreased step length - left;Shuffle;Trunk flexed Gait velocity: decreased   General Gait Details: cues for posture, position from RW, and initial sequence  Stairs            Wheelchair Mobility    Modified Rankin (Stroke Patients Only)       Balance                                             Pertinent Vitals/Pain Pain Assessment: 0-10 Pain Score: 6  Pain Location: R hip/thigh Pain Descriptors / Indicators: Aching;Sore Pain Intervention(s): Limited activity within patient's tolerance;Monitored during  session;Premedicated before session;Ice applied    Home Living Family/patient expects to be discharged to:: Private residence Living Arrangements: Children Available Help at Discharge: Family Type of Home: House Home Access: Level entry     Home Layout: One level Home Equipment: Environmental consultant - 4 wheels      Prior Function Level of Independence: Independent;Independent with assistive device(s)         Comments: using cane and rollator as needed     Hand Dominance        Extremity/Trunk Assessment   Upper Extremity Assessment Upper Extremity Assessment: Overall WFL for tasks assessed    Lower Extremity Assessment Lower Extremity Assessment: RLE deficits/detail RLE Deficits / Details: AAROM at hip to 80 flex and 15 abd with strength at hip 2+/5       Communication   Communication: No difficulties  Cognition Arousal/Alertness: Awake/alert Behavior During Therapy: WFL for tasks assessed/performed Overall Cognitive Status: Within Functional Limits for tasks assessed                                        General Comments      Exercises Total Joint Exercises Ankle Circles/Pumps: AROM;Both;15 reps;Supine Quad Sets: AROM;Both;10 reps;Supine Heel Slides: AAROM;Right;20 reps;Supine Hip ABduction/ADduction: AAROM;Right;15 reps;Supine   Assessment/Plan    PT Assessment Patient needs continued PT services  PT Problem List Decreased strength;Decreased range of  motion;Decreased activity tolerance;Decreased mobility;Decreased knowledge of use of DME;Obesity;Pain       PT Treatment Interventions DME instruction;Gait training;Functional mobility training;Therapeutic activities;Therapeutic exercise;Balance training;Patient/family education    PT Goals (Current goals can be found in the Care Plan section)  Acute Rehab PT Goals Patient Stated Goal: Regain IND PT Goal Formulation: With patient Time For Goal Achievement: 11/03/17 Potential to Achieve Goals:  Good    Frequency 7X/week   Barriers to discharge        Co-evaluation               AM-PAC PT "6 Clicks" Daily Activity  Outcome Measure Difficulty turning over in bed (including adjusting bedclothes, sheets and blankets)?: Unable Difficulty moving from lying on back to sitting on the side of the bed? : Unable Difficulty sitting down on and standing up from a chair with arms (e.g., wheelchair, bedside commode, etc,.)?: A Lot Help needed moving to and from a bed to chair (including a wheelchair)?: A Little Help needed walking in hospital room?: A Little Help needed climbing 3-5 steps with a railing? : A Little 6 Click Score: 13    End of Session   Activity Tolerance: Patient tolerated treatment well Patient left: in chair;with call bell/phone within reach Nurse Communication: Mobility status PT Visit Diagnosis: Difficulty in walking, not elsewhere classified (R26.2)    Time: 4098-1191 PT Time Calculation (min) (ACUTE ONLY): 44 min   Charges:   PT Evaluation $PT Eval Low Complexity: 1 Low PT Treatments $Gait Training: 8-22 mins $Therapeutic Exercise: 8-22 mins   PT G Codes:        Pg [phone]   Yevette Knust 10/27/2017, 12:38 PM

## 2017-10-27 NOTE — Progress Notes (Signed)
   Subjective: 1 Day Post-Op Procedure(s) (LRB): RIGHT TOTAL HIP ARTHROPLASTY ANTERIOR APPROACH (Right) Patient reports pain as mild and moderate.    Objective: Vital signs in last 24 hours: Temp:  [97.4 F (36.3 C)-98.6 F (37 C)] 98 F (36.7 C) (04/27 0909) Pulse Rate:  [59-86] 71 (04/27 0909) Resp:  [12-16] 16 (04/27 0909) BP: (98-150)/(55-86) 128/61 (04/27 0909) SpO2:  [95 %-100 %] 99 % (04/27 0909) Weight:  [132 kg (291 lb)] 132 kg (291 lb) (04/26 1555)  Intake/Output from previous day: 04/26 0701 - 04/27 0700 In: 1445 [P.O.:240; I.V.:1050; IV Piggyback:155] Out: 2425 [Urine:2125; Blood:300] Intake/Output this shift: Total I/O In: 610 [P.O.:240; I.V.:370] Out: 200 [Urine:200]  Recent Labs    10/24/17 1357 10/27/17 0512  HGB 13.5 11.3*   Recent Labs    10/24/17 1357 10/27/17 0512  WBC 5.5 9.2  RBC 4.66 3.97  HCT 40.7 34.8*  PLT 205 169   Recent Labs    10/24/17 1357 10/27/17 0512  NA 139 139  K 3.9 4.2  CL 102 102  CO2 27 26  BUN 13 12  CREATININE 0.60 0.69  GLUCOSE 90 141*  CALCIUM 9.4 9.1   No results for input(s): LABPT, INR in the last 72 hours.  Neurologically intact Dg Pelvis Portable  Result Date: 10/26/2017 CLINICAL DATA:  Right hip replacement EXAM: PORTABLE PELVIS 1-2 VIEWS COMPARISON:  None. FINDINGS: Right hip replacement in satisfactory position alignment. No immediate complication. IMPRESSION: Satisfactory right hip replacement Electronically Signed   By: Marlan Palau M.D.   On: 10/26/2017 15:08   Dg C-arm 1-60 Min-no Report  Result Date: 10/26/2017 Fluoroscopy was utilized by the requesting physician.  No radiographic interpretation.   Dg Hip Operative Unilat W Or W/o Pelvis Right  Result Date: 10/26/2017 CLINICAL DATA:  Intraoperative hip placement. EXAM: OPERATIVE RIGHT HIP (WITH PELVIS IF PERFORMED) 5 VIEWS TECHNIQUE: Fluoroscopic spot image(s) were submitted for interpretation post-operatively. COMPARISON:  No recent.  FINDINGS: Total right hip replacement. Hardware intact. Anatomic alignment. No acute bony abnormality. 0 minutes 32.3 seconds fluoroscopy. 2.5 mm Gy radiation dose. Five images obtained. IMPRESSION: Total right hip replacement with anatomic alignment. Electronically Signed   By: Maisie Fus  Register   On: 10/26/2017 13:52    Assessment/Plan: 1 Day Post-Op Procedure(s) (LRB): RIGHT TOTAL HIP ARTHROPLASTY ANTERIOR APPROACH (Right) Up with therapy, she is thinking she wants to go home tomorrow.   Eldred Manges 10/27/2017, 1:09 PM

## 2017-10-27 NOTE — Progress Notes (Signed)
Physical Therapy Treatment Patient Details Name: Valerie Barton MRN: [id number] DOB: [birth year] Today's Date: 10/27/2017    History of Present Illness Pt s/p R THR     PT Comments    POD # 1 pm session Assisted with amb in hallway then back to bed for some THR TE's followed by ICE.  Pt plans to D/C to home tomorrow and has NO stairs to enter home.   Follow Up Recommendations  Follow surgeon's recommendation for DC plan and follow-up therapies     Equipment Recommendations  (has a rollator at home so may need a RW)    Recommendations for Other Services       Precautions / Restrictions Precautions Precautions: Fall Restrictions Weight Bearing Restrictions: No Other Position/Activity Restrictions: WBAT    Mobility  Bed Mobility Overal bed mobility: Needs Assistance Bed Mobility: Sit to Supine       Sit to supine: Min assist   General bed mobility comments: assist R LE and increased time   Transfers Overall transfer level: Needs assistance Equipment used: Rolling walker (2 wheeled) Transfers: Sit to/from Stand Sit to Stand: Min guard         General transfer comment: 25% VC's on safety with turns   Ambulation/Gait Ambulation/Gait assistance: Min assist;Min guard Ambulation Distance (Feet): 125 Feet Assistive device: Rolling walker (2 wheeled) Gait Pattern/deviations: Step-to pattern;Step-through pattern;Decreased step length - right;Decreased step length - left;Shuffle;Trunk flexed Gait velocity: decreased   General Gait Details: cues for posture, position from RW, and initial sequence    decreased amb distance due to increased c/o fatigue   Stairs             Wheelchair Mobility    Modified Rankin (Stroke Patients Only)       Balance                                            Cognition Arousal/Alertness: Awake/alert Behavior During Therapy: WFL for tasks assessed/performed Overall Cognitive Status: Within Functional  Limits for tasks assessed                                        Exercises   Total Hip Replacement TE's 10 reps ankle pumps 10 reps knee presses 10 reps heel slides 10 reps SAQ's 10 reps ABD Followed by ICE     General Comments        Pertinent Vitals/Pain Pain Assessment: 0-10 Pain Score: 4  Pain Location: R hip/thigh Pain Descriptors / Indicators: Aching;Sore;Operative site guarding Pain Intervention(s): Monitored during session;Premedicated before session;Repositioned;Ice applied    Home Living Family/patient expects to be discharged to:: Private residence Living Arrangements: Children Available Help at Discharge: Family Type of Home: House Home Access: Level entry   Home Layout: One level Home Equipment: Environmental consultant - 4 wheels      Prior Function Level of Independence: Independent;Independent with assistive device(s)      Comments: using cane and rollator as needed   PT Goals (current goals can now be found in the care plan section) Acute Rehab PT Goals Patient Stated Goal: Regain IND Progress towards PT goals: Progressing toward goals    Frequency    7X/week      PT Plan      Co-evaluation  AM-PAC PT "6 Clicks" Daily Activity  Outcome Measure  Difficulty turning over in bed (including adjusting bedclothes, sheets and blankets)?: A Lot Difficulty moving from lying on back to sitting on the side of the bed? : A Lot Difficulty sitting down on and standing up from a chair with arms (e.g., wheelchair, bedside commode, etc,.)?: A Lot Help needed moving to and from a bed to chair (including a wheelchair)?: A Lot Help needed walking in hospital room?: A Lot Help needed climbing 3-5 steps with a railing? : Total 6 Click Score: 11    End of Session Equipment Utilized During Treatment: Gait belt Activity Tolerance: Patient tolerated treatment well Patient left: in chair;with call bell/phone within reach Nurse  Communication: Mobility status PT Visit Diagnosis: Difficulty in walking, not elsewhere classified (R26.2)     Time:  - 14:43 - 15:08    Charges:     1 gt   1 te                   G Codes:       Felecia Shelling  PTA [facility]  Acute  Rehab Pager      [phone]

## 2017-10-27 NOTE — Discharge Instructions (Signed)

## 2017-10-28 NOTE — Progress Notes (Signed)
   Subjective: 2 Days Post-Op Procedure(s) (LRB): RIGHT TOTAL HIP ARTHROPLASTY ANTERIOR APPROACH (Right) Patient reports pain as mild and moderate.    Objective: Vital signs in last 24 hours: Temp:  [97.6 F (36.4 C)] 97.6 F (36.4 C) (04/28 0629) Pulse Rate:  [73-87] 87 (04/28 0629) Resp:  [16-18] 18 (04/28 0629) BP: (126-140)/(77-79) 126/77 (04/28 0629) SpO2:  [98 %-99 %] 98 % (04/28 0629)  Intake/Output from previous day: 04/27 0701 - 04/28 0700 In: 1210 [P.O.:840; I.V.:370] Out: 400 [Urine:400] Intake/Output this shift: No intake/output data recorded.  Recent Labs    10/27/17 0512  HGB 11.3*   Recent Labs    10/27/17 0512  WBC 9.2  RBC 3.97  HCT 34.8*  PLT 169   Recent Labs    10/27/17 0512  NA 139  K 4.2  CL 102  CO2 26  BUN 12  CREATININE 0.69  GLUCOSE 141*  CALCIUM 9.1   No results for input(s): LABPT, INR in the last 72 hours.  Neurologically intact No results found.  Assessment/Plan: 2 Days Post-Op Procedure(s) (LRB): RIGHT TOTAL HIP ARTHROPLASTY ANTERIOR APPROACH (Right) Up with therapy, discharge home today.  Eldred Manges 10/28/2017, 9:59 AM

## 2017-10-28 NOTE — Progress Notes (Addendum)
Occupational Therapy Treatment Patient Details Name: Valerie Barton MRN: [id number] DOB: [birth year] Today's Date: 10/28/2017    History of present illness Pt s/p R THR    OT comments  Pt plans to DC this day with her daughter to [facility].  RN notified pt will need a tub bench  Follow Up Recommendations  No OT follow up    Equipment Recommendations  Tub/shower bench(Pt weights 280 and will need tub bench to accomodate)       Precautions / Restrictions Precautions Precautions: Fall Restrictions Weight Bearing Restrictions: No Other Position/Activity Restrictions: WBAT       Mobility Bed Mobility           Sit to supine: Min assist   General bed mobility comments: Pt OOB  Transfers Overall transfer level: Needs assistance Equipment used: Rolling walker (2 wheeled) Transfers: Sit to/from [employer] Sit to Stand: Supervision Stand pivot transfers: Supervision                ADL either performed or assessed with clinical judgement   ADL Overall ADL's : Needs assistance/impaired Eating/Feeding: Set up;Sitting   Grooming: Standing;Supervision/safety               Lower Body Dressing: Sit to/from stand;Cueing for sequencing;Cueing for safety;Minimal assistance Lower Body Dressing Details (indicate cue type and reason): educated and issue Ae (pt is medicaid) Statistician: Min guard;RW;Comfort height toilet;Ambulation;Cueing for safety;Cueing for sequencing   Toileting- Clothing Manipulation and Hygiene: Sit to/from stand;Cueing for safety;Supervision/safety         General ADL Comments: pt with increased safety awareness this day.  Issued and educated pt in AE. Pt appreciative and did well with AE   Educated pt on use of tub bench. Pt declined to practice but feels it will be beneficial for home as stepping over the tub is not safe for pt at this time. RN notified who will follow up with CM to ensure pt gets a tub bench that will accomodate  her weight.     Vision Patient Visual Report: No change from baseline            Cognition Arousal/Alertness: Awake/alert Behavior During Therapy: WFL for tasks assessed/performed Overall Cognitive Status: Within Functional Limits for tasks assessed                                                     Pertinent Vitals/ Pain       Pain Score: 3  Pain Location: R hip/thigh Pain Descriptors / Indicators: Aching;Discomfort Pain Intervention(s): Limited activity within patient's tolerance;Monitored during session;Repositioned         Frequency  Min 2X/week        Progress Toward Goals  OT Goals(current goals can now be found in the care plan section)  Progress towards OT goals: Progressing toward goals     Plan Discharge plan remains appropriate    Co-evaluation                 AM-PAC PT "6 Clicks" Daily Activity     Outcome Measure   Help from another person eating meals?: None Help from another person taking care of personal grooming?: None Help from another person toileting, which includes using toliet, bedpan, or urinal?: A Little Help from another person bathing (including washing, rinsing, drying)?: A Little Help  from another person to put on and taking off regular upper body clothing?: None Help from another person to put on and taking off regular lower body clothing?: A Little 6 Click Score: 21    End of Session Equipment Utilized During Treatment: Rolling walker  OT Visit Diagnosis: Unsteadiness on feet (R26.81);Other abnormalities of gait and mobility (R26.89)   Activity Tolerance Patient tolerated treatment well   Patient Left in chair   Nurse Communication Mobility status        Time: 0102-7253 OT Time Calculation (min): 25 min  Charges: OT General Charges $OT Visit: 1 Visit OT Treatments $Self Care/Home Management : 23-37 mins  Seth Ward, [place] [phone]   Alba Cory 10/28/2017, 10:22  AM

## 2017-10-28 NOTE — Progress Notes (Signed)
Physical Therapy Treatment Patient Details Name: Valerie Barton MRN: [id number] DOB: 1955-11-13 Today's Date: 10/28/2017    History of Present Illness Pt s/p R THR     PT Comments    Pt taking pain medicine upon arrival and pain 9/10 however pt agreeable to mobilize as pain meds make her groggy.  Pt ambulated distance to tolerance with rollator and min/guard for safety/pain only.  Pt prefers to d/c home today.  Verbally reviewed HEP handout and discussed exercises (pt agreeable to perform once home and hopefully in less pain).  Pt had no further questions.     Follow Up Recommendations  Follow surgeon's recommendation for DC plan and follow-up therapies     Equipment Recommendations  None recommended by PT    Recommendations for Other Services       Precautions / Restrictions Precautions Precautions: Fall Restrictions Weight Bearing Restrictions: No Other Position/Activity Restrictions: WBAT    Mobility  Bed Mobility Overal bed mobility: Needs Assistance Bed Mobility: Sit to Supine       Sit to supine: Mod assist   General bed mobility comments: assist for Bil LEs onto bed due to pain  Transfers Overall transfer level: Needs assistance Equipment used: 4-wheeled walker Transfers: Sit to/from Stand Sit to Stand: Supervision Stand pivot transfers: Supervision       General transfer comment: verbal cues for R LE forward to assist with pain control  Ambulation/Gait Ambulation/Gait assistance: Min guard Ambulation Distance (Feet): 45 Feet Assistive device: 4-wheeled walker Gait Pattern/deviations: Step-to pattern;Decreased stance time - right;Antalgic Gait velocity: decreased   General Gait Details: verbal cues for step length, pt reports high pain today so distance to tolerance, utilized rollator as pt prefers to use this at home (does have access to RW if needed)   Social research officer, government Rankin (Stroke Patients Only)        Balance                                            Cognition Arousal/Alertness: Awake/alert Behavior During Therapy: WFL for tasks assessed/performed Overall Cognitive Status: Within Functional Limits for tasks assessed                                        Exercises      General Comments        Pertinent Vitals/Pain Pain Assessment: 0-10 Pain Score: 9  Pain Location: R hip/thigh Pain Descriptors / Indicators: Aching;Discomfort;Sore Pain Intervention(s): RN gave pain meds during session;Monitored during session;Limited activity within patient's tolerance;Repositioned;Ice applied    Home Living                      Prior Function            PT Goals (current goals can now be found in the care plan section) Progress towards PT goals: Progressing toward goals    Frequency    7X/week      PT Plan Current plan remains appropriate    Co-evaluation              AM-PAC PT "6 Clicks" Daily Activity  Outcome Measure  Difficulty turning over in bed (including adjusting bedclothes, sheets  and blankets)?: A Lot Difficulty moving from lying on back to sitting on the side of the bed? : Unable Difficulty sitting down on and standing up from a chair with arms (e.g., wheelchair, bedside commode, etc,.)?: Unable Help needed moving to and from a bed to chair (including a wheelchair)?: A Little Help needed walking in hospital room?: A Little Help needed climbing 3-5 steps with a railing? : A Lot 6 Click Score: 12    End of Session Equipment Utilized During Treatment: Gait belt Activity Tolerance: Patient limited by pain Patient left: with call bell/phone within reach;in bed Nurse Communication: Mobility status PT Visit Diagnosis: Difficulty in walking, not elsewhere classified (R26.2)     Time: 1610-9604 PT Time Calculation (min) (ACUTE ONLY): 15 min  Charges:  $Gait Training: 8-22 mins                    G  Codes:       Zenovia Jarred, PT, DPT 10/28/2017 Pager: [phone]  Maida Sale E 10/28/2017, 11:45 AM

## 2017-10-28 NOTE — Progress Notes (Signed)
Discharged from floor via w/c for transport home by car. Belongings & family with pt. No changes in assessment. Valerie Barton  

## 2017-10-28 NOTE — Care Management Note (Signed)
Case Management Note  Patient Details  Name: Valerie Barton MRN: [id number] Date of Birth: 1955/08/10  Subjective/Objective:    Right THA                Action/Plan: NCM spoke to pt and offered choice for HH/list provided. Pt agreeable to [facility]. States she has Rollator and 3n1 bc. She is requesting tub bench. Family is at home to assist with care. Contacted [facility] for delivery of DME to room prior to dc.   Expected Discharge Date:  10/28/17               Expected Discharge Plan:  Home w Home Health Services  In-House Referral:  NA  Discharge planning Services  CM Consult  Post Acute Care Choice:  Home Health Choice offered to:  Patient  DME Arranged:  Walker rolling DME Agency:  [facility]  HH Arranged:  PT HH Agency:  [facility]  Status of Service:  Completed, signed off  If discussed at [employer] of Stay Meetings, dates discussed:    Additional Comments:  Elliot Cousin, RN 10/28/2017, 4:14 PM

## 2017-10-28 NOTE — Progress Notes (Signed)
Informed by Lawson Fiscal, OT, that pt will need tub bench with 300# capacity. Message left on CM cell phone. Erhardt Dada, [employer]

## 2017-10-28 NOTE — Discharge Summary (Signed)
Patient ID: Valerie Barton MRN: [id number] DOB/AGE: 1955/09/04 62 y.o.  Admit date: 10/26/2017 Discharge date: 10/28/2017  Admission Diagnoses:  Principal Problem:   Unilateral primary osteoarthritis, right hip Active Problems:   Status post total replacement of right hip   Discharge Diagnoses:  Same  Past Medical History:  Diagnosis Date  . Anxiety    some anxiety attacks 20 years ago   . Chronic back pain   . GERD (gastroesophageal reflux disease)    on occ   . Heart palpitations    patietn states this is a famailial trait ; palpitations are occassional , more frequent with caffeine or if taking psudafed   . HGSIL Pap smear of vagina   . Hypertension   . Morbid obesity (HCC)   . OA (osteoarthritis) of hip    Right  . Wears dentures     Surgeries: Procedure(s): RIGHT TOTAL HIP ARTHROPLASTY ANTERIOR APPROACH on 10/26/2017   Consultants:   Discharged Condition: Improved  Hospital Course: Valerie Barton is an 62 y.o. female who was admitted 10/26/2017 for operative treatment ofUnilateral primary osteoarthritis, right hip. Patient has severe unremitting pain that affects sleep, daily activities, and work/hobbies. After pre-op clearance the patient was taken to the operating room on 10/26/2017 and underwent  Procedure(s): RIGHT TOTAL HIP ARTHROPLASTY ANTERIOR APPROACH.    Patient was given perioperative antibiotics:  Anti-infectives (From admission, onward)   Start     Dose/Rate Route Frequency Ordered Stop   10/27/17 0000  doxycycline (VIBRA-TABS) 100 MG tablet     100 mg Oral 2 times daily 10/27/17 1404     10/26/17 1800  ceFAZolin (ANCEF) IVPB 2g/100 mL premix     2 g 200 mL/hr over 30 Minutes Intravenous Every 6 hours 10/26/17 1608 10/27/17 0115   10/26/17 1100  ceFAZolin (ANCEF) 3 g in dextrose 5 % 50 mL IVPB     3 g 130 mL/hr over 30 Minutes Intravenous On call to O.R. 10/26/17 1050 10/26/17 1241   09/28/17 0600  ceFAZolin (ANCEF) 3 g in dextrose 5 % 50 mL IVPB     3 g 130 mL/hr over 30 Minutes Intravenous On call to O.R. 09/27/17 1325 09/29/17 0559       Patient was given sequential compression devices, early ambulation, and chemoprophylaxis to prevent DVT.  Patient benefited maximally from hospital stay and there were no complications.    Recent vital signs:  Patient Vitals for the past 24 hrs:  BP Temp Temp src Pulse Resp SpO2  10/28/17 0629 126/77 97.6 F (36.4 C) Oral 87 18 98 %     Recent laboratory studies:  Recent Labs    10/27/17 0512  WBC 9.2  HGB 11.3*  HCT 34.8*  PLT 169  NA 139  K 4.2  CL 102  CO2 26  BUN 12  CREATININE 0.69  GLUCOSE 141*  CALCIUM 9.1     Discharge Medications:   Allergies as of 10/28/2017      Reactions   Other Other (See Comments)   Patient states percocet, dilaudid, or morphine=gallbladder attacks symptoms.      Medication List    STOP taking these medications   aspirin EC 81 MG tablet Replaced by:  aspirin 81 MG chewable tablet   HYDROcodone-acetaminophen 10-325 MG tablet Commonly known as:  NORCO     TAKE these medications   aspirin 81 MG chewable tablet Chew 1 tablet (81 mg total) by mouth 2 (two) times daily. Replaces:  aspirin EC 81 MG tablet  doxycycline 100 MG tablet Commonly known as:  VIBRA-TABS Take 1 tablet (100 mg total) by mouth 2 (two) times daily.   fluticasone 50 MCG/ACT nasal spray Commonly known as:  FLONASE Place 1-2 sprays into both nostrils daily as needed for allergies.   Garlic Oil 1000 MG Caps Take 2,000 mg by mouth daily.   hydrochlorothiazide 25 MG tablet Commonly known as:  HYDRODIURIL Take 25 mg by mouth daily.   Krill Oil 350 MG Caps Take 350 mg by mouth daily.   methocarbamol 500 MG tablet Commonly known as:  ROBAXIN Take 1 tablet (500 mg total) by mouth every 6 (six) hours as needed for muscle spasms.   oxyCODONE 5 MG immediate release tablet Commonly known as:  Oxy IR/ROXICODONE Take 1-2 tablets (5-10 mg total) by mouth every 4  (four) hours as needed for moderate pain (pain score 4-6).       Diagnostic Studies: Dg Pelvis Portable  Result Date: 10/26/2017 CLINICAL DATA:  Right hip replacement EXAM: PORTABLE PELVIS 1-2 VIEWS COMPARISON:  None. FINDINGS: Right hip replacement in satisfactory position alignment. No immediate complication. IMPRESSION: Satisfactory right hip replacement Electronically Signed   By: Marlan Palau M.D.   On: 10/26/2017 15:08   Dg C-arm 1-60 Min-no Report  Result Date: 10/26/2017 Fluoroscopy was utilized by the requesting physician.  No radiographic interpretation.   Dg Hip Operative Unilat W Or W/o Pelvis Right  Result Date: 10/26/2017 CLINICAL DATA:  Intraoperative hip placement. EXAM: OPERATIVE RIGHT HIP (WITH PELVIS IF PERFORMED) 5 VIEWS TECHNIQUE: Fluoroscopic spot image(s) were submitted for interpretation post-operatively. COMPARISON:  No recent. FINDINGS: Total right hip replacement. Hardware intact. Anatomic alignment. No acute bony abnormality. 0 minutes 32.3 seconds fluoroscopy. 2.5 mm Gy radiation dose. Five images obtained. IMPRESSION: Total right hip replacement with anatomic alignment. Electronically Signed   By: Maisie Fus  Register   On: 10/26/2017 13:52    Disposition: to home    Follow-up Information    Kathryne Hitch, MD Follow up in 2 week(s).   Specialty:  Orthopedic Surgery Contact information: [address] [place] [address] [phone]        [facility] Follow up.   Specialty:  Home Health Services Why:  Home Health Physical Therapy-agency will call to arrange inital visit Contact information: [address] [place] [address] [place] [address] [phone]            Signed: Kathryne Hitch 10/28/2017, 7:20 PM

## 2017-10-29 NOTE — Op Note (Signed)
NAME:  Valerie Barton, Valerie Barton                   ACCOUNT NO.:  MEDICAL RECORD NO.:  [id number]  LOCATION:                                 FACILITY:  PHYSICIAN:  Vanita Panda. Magnus Ivan, M.D.DATE OF BIRTH:  DATE OF PROCEDURE:  10/26/2017 DATE OF DISCHARGE:                              OPERATIVE REPORT   PREOPERATIVE DIAGNOSES: 1. Primary osteoarthritis and degenerative joint disease, right hip. 2. Severe morbid obesity with a BMI over 50.  POSTOPERATIVE DIAGNOSES: 1. Primary osteoarthritis and degenerative joint disease, right hip. 2. Severe morbid obesity with a BMI over 50.  PROCEDURE:  Right total hip arthroplasty through direct anterior approach.  IMPLANTS:  DePuy Sector Gription acetabular component size 56, size 36 +0 neutral polyethylene liner, size 14 Corail femoral component with standard offset, size 36 +8.5 ceramic hip ball.  SURGEON:  Vanita Panda. Magnus Ivan, M.D.  ASSISTANT:  Richardean Canal, PA-C.  ANESTHESIA:  Spinal.  ANTIBIOTICS:  3 g of IV Ancef.  BLOOD LOSS:  300 cc.  COMPLICATIONS:  None.  INDICATIONS:  The patient is a 62 year old female, who is morbidly obese with BMI of greater than 50.  Unfortunately, she has severe debilitating arthritis involving her right hip and she has tried as much as possible to lose weight, but her hip pain is severe and it is daily.  Her x-ray showed complete loss of the joint space.  Her right leg is slightly shorter than left as well.  At this point, she wished to proceed with a direct anterior approach hip surgery.  I talked to her about the difficulty of any approach given her massive obesity and her body mass has been so high, she has a vague thigh.  I have told her that the biggest complication of this surgery is soft tissue issues and breakdown with adipose tissue necrosis taken all easily to infection and then a catastrophic outcome.  After all this discussion, she still wished to proceed with surgery in spite of my  precautions.  Risks and benefits of the surgery again were discussed in detail.  She also understands the risk of acute blood loss anemia, nerve and vessel injury, fracture, infection, dislocation, DVT and implant failure given her obesity.  She understands our goals are to decrease pain, improve mobility, and overall improved quality of life.  PROCEDURE DESCRIPTION:  After informed consent was obtained, appropriate right hip was marked.  She was brought to the operating room where spinal anesthesia was obtained while she was on her stretcher.  She was laid in a supine position on a stretcher.  A Foley catheter was placed and both feet had traction boots applied to them.  Next, she was placed supine on the Hana fracture table with the perineal post in place and both legs in inline skeletal traction devices, but no traction applied. Her right operative hip was prepped and draped with DuraPrep and sterile drapes.  Time-out was called and she was identified as correct patient and correct right hip.  We then made an incision just inferior and posterior to the anterior superior iliac spine and carried this obliquely down the leg.  We dissected down the tensor fascia lata muscle.  The tensor fascia was then divided longitudinally to proceed with a direct anterior approach to the hip.  We identified and cauterized the circumflex vessels and then identified the hip capsule. I opened up the hip capsule in an L-type format, finding a moderate joint effusion and significant periarticular osteophytes around the hip. We then placed Cobra retractors around the medial and lateral femoral neck and made our femoral neck cut with an oscillating saw just proximal to the lesser trochanter and completed this with an osteotome.  We placed a corkscrew guide in the femoral head and removed the femoral head in its entirety.  We found it to be devoid of cartilage.  We then placed a bent Hohmann over the medial  acetabular rim and removed remnants of the acetabular labrum and other debris.  We then began reaming from a size 48 reamer in stepwise increments going all the way up to a size 55 with all reamers under direct visualization, the last reamer under direct fluoroscopy, so we could obtain our depth of reaming, our inclination, and anteversion.  Once we were able to provide this adequately, we placed the real DePuy Sector Gription acetabular component size 56 and then a 36 +0 polyethylene liner for that size acetabular component.  Attention was then turned to the femur.  With the leg externally rotated to 120 degrees, extended and adducted, we were able to place a Mueller retractor medially and a Hohmann retractor behind the greater trochanter.  We released the lateral joint capsule and used a box cutting osteotome to the enter the femoral canal and a rongeur to lateralize.  I then began broaching using the Corail broaching system from a size 8 going all the way up to the size 14 in stepwise increments.  With the 14 in place, we trialed a standard offset femoral neck and a 36 +1.5 hip ball.  We reduced this in the acetabulum and it was stable.  We definitely need more offset and leg length.  We dislocated the hip and removed the trial components.  We then placed the real Corail femoral component size 14 and I went with a 36 +8.5 hip ball, given the fact that we wanted as much tightness as we could give her severe morbid obesity.  We were able to place this without difficulty and reduced this in the acetabulum and it was stable.  We irrigated the soft tissue with normal saline solution using pulsatile lavage.  We closed the joint capsule with interrupted #1 Ethibond suture followed by running #1 Vicryl in the tensor fascia, 0 Vicryl in the deep tissue, 2-0 Vicryl in the subcutaneous tissue and interrupted 2-0 nylon on the skin.  Xeroform and Aquacel dressing were applied.  She was then taken  off the Hana table and taken to the recovery room in stable condition.  All final counts were correct.  There were no complications noted.  Of note, Richardean Canal, PA-C assisted in the entire case.  His assistance was crucial for facilitating all aspects of this case.     Vanita Panda. Magnus Ivan, M.D.     CYB/MEDQ  D:  10/26/2017  T:  10/27/2017  Job:  [id number]

## 2017-11-06 ENCOUNTER — Encounter (INDEPENDENT_AMBULATORY_CARE_PROVIDER_SITE_OTHER): Payer: Self-pay | Admitting: Orthopaedic Surgery

## 2017-11-06 ENCOUNTER — Ambulatory Visit (INDEPENDENT_AMBULATORY_CARE_PROVIDER_SITE_OTHER): Payer: Medicaid Other | Admitting: Orthopaedic Surgery

## 2017-11-06 DIAGNOSIS — Z96641 Presence of right artificial hip joint: Secondary | ICD-10-CM

## 2017-11-06 MED ORDER — HYDROCODONE-ACETAMINOPHEN 10-325 MG PO TABS: 1.0000 | ORAL_TABLET | ORAL | 0 refills | Status: DC | PRN

## 2017-11-06 NOTE — Progress Notes (Signed)
The patient is now 11 days status post a right total hip arthroplasty through direct anterior approach.  She is some with a body mass index of over 50.  She is on antibiotics that we put her on just prophylactically due to the size of her thigh and soft tissue.  She is also been on chronic narcotic pain medication as well.  She is ambulate with a rolling walker and seems to be doing well.  On examination of her right hip incision the suture line is intact.  There is no redness.  It looks good overall but is too early to remove any sutures.  I like to see her back next week for suture removal.  We may drain any fluid from her hip if needed.  We will have her on hydrocodone 10-325's and I did refill this today.  In 1 week we do not need x-rays.

## 2017-11-12 ENCOUNTER — Telehealth (INDEPENDENT_AMBULATORY_CARE_PROVIDER_SITE_OTHER): Payer: Self-pay

## 2017-11-12 NOTE — Telephone Encounter (Signed)
Therapist calling to advise that they saw pt for initial eval but are still awaiting medicaid auth so they have not been able to see her again and just wanted to let us know

## 2017-11-12 NOTE — Telephone Encounter (Signed)
FYI

## 2017-11-13 ENCOUNTER — Ambulatory Visit (INDEPENDENT_AMBULATORY_CARE_PROVIDER_SITE_OTHER): Payer: Medicaid Other | Admitting: Orthopaedic Surgery

## 2017-11-14 ENCOUNTER — Ambulatory Visit (INDEPENDENT_AMBULATORY_CARE_PROVIDER_SITE_OTHER): Payer: Medicaid Other | Admitting: Orthopaedic Surgery

## 2017-11-14 ENCOUNTER — Encounter (INDEPENDENT_AMBULATORY_CARE_PROVIDER_SITE_OTHER): Payer: Self-pay | Admitting: Orthopaedic Surgery

## 2017-11-14 DIAGNOSIS — Z96641 Presence of right artificial hip joint: Secondary | ICD-10-CM

## 2017-11-14 MED ORDER — FLUCONAZOLE 150 MG PO TABS: 150.0000 mg | ORAL_TABLET | Freq: Every day | ORAL | 0 refills | Status: DC

## 2017-11-14 NOTE — Progress Notes (Signed)
The patient is now 19 days status post a right total hip arthroplasty direct anterior approach.  We closed this with sutures.  We are concerned about her soft tissue and her morbid obesity.  She ambulates with a cane and does report improvement in her mobility and decreased pain in general.  On examination her incision looks good so I remove the sutures and placed Steri-Strips.  There is no breakdown in the soft tissues and no significant seroma.  She continue increase her activities as she tolerates.  I will send in 1 dose of Diflucan due to yeast infection from antibiotics.  I will also refill her hydrocodone when she runs out of this next mild dysuria last week.

## 2017-11-19 ENCOUNTER — Other Ambulatory Visit (INDEPENDENT_AMBULATORY_CARE_PROVIDER_SITE_OTHER): Payer: Self-pay | Admitting: Orthopaedic Surgery

## 2017-11-19 ENCOUNTER — Telehealth (INDEPENDENT_AMBULATORY_CARE_PROVIDER_SITE_OTHER): Payer: Self-pay | Admitting: Orthopaedic Surgery

## 2017-11-19 MED ORDER — HYDROCODONE-ACETAMINOPHEN 10-325 MG PO TABS: 1.0000 | ORAL_TABLET | ORAL | 0 refills | Status: DC | PRN

## 2017-11-19 NOTE — Telephone Encounter (Signed)
Please advise 

## 2017-11-19 NOTE — Telephone Encounter (Signed)
Patient called needing Rx refilled (Hydrocodone) Patient said she is having groin and right hip pain. Patient said it's very stiff and she can not walk without her cane. The number to contact patient is [phone]

## 2017-11-19 NOTE — Telephone Encounter (Signed)
I sent some in 

## 2017-11-21 NOTE — Telephone Encounter (Signed)
Patient called back asking for a call back from you in regards to the pain she's been having. CB # [phone]

## 2017-11-21 NOTE — Telephone Encounter (Signed)
Patient aware that the soreness she is complaining of is "normal" pain from PT

## 2017-11-27 ENCOUNTER — Telehealth (INDEPENDENT_AMBULATORY_CARE_PROVIDER_SITE_OTHER): Payer: Self-pay | Admitting: Orthopaedic Surgery

## 2017-11-27 NOTE — Telephone Encounter (Signed)
Patient called advised she experiencing a lot of pain in her back. Patient said she is also having a lot of pain in her outer right  Thigh.Patient said she is having trouble walking without her cane. The number to contact patient is [phone]

## 2017-11-27 NOTE — Telephone Encounter (Signed)
Likely inflammation as she is dealing with getting over her hip surgery as well as weight related.  She needs to try 3-4 advil, 2-3 times daily with meals.  We can also send her to outpatient PT is she wants and they can work on helping her get better.

## 2017-11-27 NOTE — Telephone Encounter (Signed)
She would like to try PT 

## 2017-11-28 ENCOUNTER — Other Ambulatory Visit (INDEPENDENT_AMBULATORY_CARE_PROVIDER_SITE_OTHER): Payer: Self-pay

## 2017-11-28 DIAGNOSIS — Z96641 Presence of right artificial hip joint: Secondary | ICD-10-CM

## 2017-11-28 NOTE — Telephone Encounter (Signed)
Sent order, patient aware

## 2017-11-28 NOTE — Telephone Encounter (Signed)
Please order PT as outpatient for them to work on her low back and right hip. thanks

## 2017-11-30 ENCOUNTER — Telehealth (INDEPENDENT_AMBULATORY_CARE_PROVIDER_SITE_OTHER): Payer: Self-pay

## 2017-11-30 ENCOUNTER — Other Ambulatory Visit (INDEPENDENT_AMBULATORY_CARE_PROVIDER_SITE_OTHER): Payer: Self-pay | Admitting: Orthopaedic Surgery

## 2017-11-30 MED ORDER — HYDROCODONE-ACETAMINOPHEN 10-325 MG PO TABS: 1.0000 | ORAL_TABLET | Freq: Four times a day (QID) | ORAL | 0 refills | Status: DC | PRN

## 2017-11-30 NOTE — Telephone Encounter (Signed)
Patient requesting refill of pain medication.

## 2017-11-30 NOTE — Telephone Encounter (Signed)
I sent some more in. 

## 2017-11-30 NOTE — Telephone Encounter (Signed)
IC patient and advised.  

## 2017-11-30 NOTE — Telephone Encounter (Signed)
Request pain medication

## 2017-12-04 ENCOUNTER — Ambulatory Visit: Payer: Medicaid Other | Admitting: Physical Therapy

## 2017-12-05 ENCOUNTER — Encounter: Payer: Self-pay | Admitting: Physical Therapy

## 2017-12-05 ENCOUNTER — Other Ambulatory Visit: Payer: Self-pay

## 2017-12-05 ENCOUNTER — Ambulatory Visit: Payer: Medicaid Other | Attending: Orthopaedic Surgery | Admitting: Physical Therapy

## 2017-12-05 DIAGNOSIS — M6281 Muscle weakness (generalized): Secondary | ICD-10-CM | POA: Insufficient documentation

## 2017-12-05 DIAGNOSIS — R262 Difficulty in walking, not elsewhere classified: Secondary | ICD-10-CM | POA: Insufficient documentation

## 2017-12-05 DIAGNOSIS — M25551 Pain in right hip: Secondary | ICD-10-CM

## 2017-12-05 DIAGNOSIS — R2681 Unsteadiness on feet: Secondary | ICD-10-CM | POA: Insufficient documentation

## 2017-12-05 NOTE — Patient Instructions (Signed)
   BRIDGING ELASTIC BAND ABDUCTION  While lying on your back, place an elastic band around your knees and pull your knees apart. Hold this and then tighten your lower abdominals, squeeze your buttocks and raise your buttocks off the floor/bed as creating a "Bridge" with your body.  Repeat 3-5 times with resistance band, 2-3 times per day.

## 2017-12-05 NOTE — Therapy (Signed)
[facility] [address] [place], [place], [address] Phone: [phone]   Fax:  [phone]  Physical Therapy Evaluation  Patient Details  Name: Valerie MerinoJoiann Barton MRN: [id number] Date of Birth: [birth year] Referring Provider: Doneen Poissonhristopher Blackman    Encounter Date: 12/05/2017  PT End of Session - 12/05/17 1440    Visit Number  1    Number of Visits  4    Date for PT Re-Evaluation  01/02/18    Authorization Type  Medicaid (submitted on 12/05/17)    Authorization Time Period  12/05/17 to 01/04/18    Authorization - Visit Number  1    Authorization - Number of Visits  4    PT Start Time  1340    PT Stop Time  1425    PT Time Calculation (min)  45 min    Activity Tolerance  Patient tolerated treatment well    Behavior During Therapy  [facility] for tasks assessed/performed       Past Medical History:  Diagnosis Date  . Anxiety    some anxiety attacks 20 years ago   . Chronic back pain   . GERD (gastroesophageal reflux disease)    on occ   . Heart palpitations    patietn states this is a famailial trait ; palpitations are occassional , more frequent with caffeine or if taking psudafed   . HGSIL Pap smear of vagina   . Hypertension   . Morbid obesity (HCC)   . OA (osteoarthritis) of hip    Right  . Wears dentures     Past Surgical History:  Procedure Laterality Date  . CHOLECYSTECTOMY  07/1984  . FOOT SURGERY Bilateral 2003   may have been bone spurs on small toe   . TOTAL HIP ARTHROPLASTY Right 10/26/2017   Procedure: RIGHT TOTAL HIP ARTHROPLASTY ANTERIOR APPROACH;  Surgeon: Kathryne HitchBlackman, Christopher Y, MD;  Location: [facility];  Service: Orthopedics;  Laterality: Right;    There were no vitals filed for this visit.   Subjective Assessment - 12/05/17 1340    Subjective  I had my R hip replaced via anterior approach 5 weeks ago; it was end stage OA and there was nothing else they could do besides surgery. Surgery and healing went well at  first, I was getting around OK at first but its been painful. I'm now having a lot of pain in my R knee and in my back and it is interfering with my life. I do my exercises at home, I'm constantly walking, it is difficult to walk without the cane due to pain. The pain is in both hips muscularly now. I think I just had 3 visits from home health PT.     Pertinent History  R anterior hip 5 weeks ago, obesity    How long can you sit comfortably?  does not bother me as much but I need to shift around     How long can you stand comfortably?  without cane, just a few minutes, less than 5 minutes     How long can you walk comfortably?  without cane, immediate discomfort     Patient Stated Goals  get rid of pain, get back to normal activities, get active again     Currently in Pain?  Yes    Pain Score  6     Pain Location  Other (Comment) low back and R knee     Pain Orientation  Right;Lower    Pain Descriptors / Indicators  Aching;Throbbing    Pain Type  Chronic pain    Pain Radiating Towards  radiates down outer hip into the front of R knee     Pain Onset  More than a month ago    Pain Frequency  Constant    Aggravating Factors   sitting still for too long, getting up without support of cane     Pain Relieving Factors  medicines, ibuprofen     Effect of Pain on Daily Activities  severe          [facility] PT Assessment - 12/05/17 0001      Assessment   Medical Diagnosis  s/p R anterior hip     Referring Provider  Doneen Poisson     Onset Date/Surgical Date  11/28/17    Next MD Visit  Dr. Magnus Ivan June 19th     Prior Therapy  [facility] 3 visits       Precautions   Precautions  Anterior Hip    Precaution Booklet Issued  No      Restrictions   Weight Bearing Restrictions  No      Balance Screen   Has the patient fallen in the past 6 months  No    Has the patient had a decrease in activity level because of a fear of falling?   Yes    Is the patient reluctant to leave their home because of a  fear of falling?   No      Prior Function   Level of Independence  Independent;Independent with basic ADLs;Independent with gait;Independent with transfers    Vocation  Retired    Leisure  exercising, takes care of disabled son, being outside, gardening, shopping       ROM / Strength   AROM / PROM / Strength  Strength;AROM      AROM   AROM Assessment Site  Lumbar    Lumbar Flexion  full ROM, RFIS no change     Lumbar Extension  hypermobile, REIS slightly better       Strength   Strength Assessment Site  Hip;Knee    Right/Left Hip  Right;Left    Right Hip Flexion  3-/5    Right Hip ABduction  4/5    Left Hip Flexion  3+/5    Left Hip ABduction  3/5    Right/Left Knee  Right;Left    Right Knee Flexion  4/5    Right Knee Extension  4/5    Left Knee Flexion  4+/5    Left Knee Extension  4+/5      Palpation   Palpation comment  TTP lateral R thigh, glutes, mild TTP R lumbar paraspinals                 Objective measurements completed on examination: See above findings.              PT Education - 12/05/17 1438    Education Details  exam findings, POC, HEP review of post surgical exercises/take out squats/add bridges with TB, everyone starts from a different point after surgery, impact of impairments pre-surgery in comparison to post-surgical status, Mediciad visit allowances     Person(s) Educated  Patient    Methods  Explanation;Handout;Demonstration    Comprehension  Verbalized understanding;Need further instruction       PT Short Term Goals - 12/05/17 1443      PT SHORT TERM GOAL #1   Title  patient to be compliant with appropriate HEP to be updated  PRN     Time  1    Period  Weeks    Status  New    Target Date  12/12/17        PT Long Term Goals - 12/05/17 1444      PT LONG TERM GOAL #1   Title  Patient to be able to ambulate with heel-toe pattern and equal step lengths, consistent heel strike and no assistive device in order to improve  access to home and community     Time  3    Period  Weeks    Status  New    Target Date  12/26/17      PT LONG TERM GOAL #2   Title  Patient to report pain as being no more than 2/10 R knee, hip,and low back in order to improve QOL and tolerance to activity     Time  3    Period  Weeks    Status  New      PT LONG TERM GOAL #3   Title  Patient to demonstrate MMT as having improved by at least 1 grade in all tested groups in order to assist in reducing pain and improving tolerance to mobility     Time  3    Period  Weeks    Status  New      PT LONG TERM GOAL #4   Title  Patient to demonstrate at least 50% resolution in soft tissue restrictions in order to assist in reducing pain and improving general tolerance to activity     Time  3    Period  Weeks    Status  New             Plan - 12/05/17 1441    Clinical Impression Statement  Patient arrives after receiving R anterior hip replacement which was done on 10/26/17 by Dr. Doneen Poisson; she initially felt better after having her surgery, but has begun to have significant pain in her R knee as well as her low back. Examination reveals possible lumbar extension preference, significant soft tissue knotting through R lumbar paraspinals/glutes/piriformis/TFL/lateral R thigh, severe functional muscle weakness, gross unsteadiness, and severe functional gait deviation with and without SPC. She will benefit from skilled PT services in order to address functional deficits, reduce pain, and reduce fall risk moving forward.     History and Personal Factors relevant to plan of care:  hx R anterior hip replacement on 10/26/17    Clinical Presentation  Stable    Rehab Potential  Good    PT Frequency  1x / week    PT Duration  3 weeks    PT Treatment/Interventions  ADLs/Self Care Home Management;Biofeedback;Cryotherapy;Electrical Stimulation;Iontophoresis 4mg /ml Dexamethasone;Moist Heat;Traction;Ultrasound;DME Instruction;Gait  training;Functional mobility training;Stair training;Therapeutic activities;Therapeutic exercise;Balance training;Neuromuscular re-education;Patient/family education;Manual techniques;Scar mobilization;Passive range of motion;Dry needling;Taping    PT Next Visit Plan  review HEP and goals; continue and teach scar massage, STM as tolerated, functional hip and core strength as tolerated, update HEP     PT Home Exercise Plan  Eval: continue surgical HEP but take out squats, add bridge with TB     Consulted and Agree with Plan of Care  Patient       Patient will benefit from skilled therapeutic intervention in order to improve the following deficits and impairments:  Abnormal gait, Increased fascial restricitons, Improper body mechanics, Pain, Decreased coordination, Decreased mobility, Decreased scar mobility, Increased muscle spasms, Postural dysfunction, Decreased activity tolerance, Decreased strength, Decreased balance,  Difficulty walking, Impaired flexibility  Visit Diagnosis: Pain in right hip - Plan: PT plan of care cert/re-cert  Muscle weakness (generalized) - Plan: PT plan of care cert/re-cert  Difficulty in walking, not elsewhere classified - Plan: PT plan of care cert/re-cert  Unsteadiness on feet - Plan: PT plan of care cert/re-cert     Problem List Patient Active Problem List   Diagnosis Date Noted  . Status post total replacement of right hip 10/26/2017  . Unilateral primary osteoarthritis, right hip 12/28/2016  . Pain in right hip 12/28/2016    Nedra Hai PT, DPT, CBIS  Supplemental Physical Therapist [facility]   Pager [phone]   [facility] [address] [place], [place], [address] Phone: [phone]   Fax:  [phone]  Name: Breniya Goertzen MRN: [id number] Date of Birth: 1955/09/23

## 2017-12-07 ENCOUNTER — Telehealth (INDEPENDENT_AMBULATORY_CARE_PROVIDER_SITE_OTHER): Payer: Self-pay

## 2017-12-07 ENCOUNTER — Telehealth (INDEPENDENT_AMBULATORY_CARE_PROVIDER_SITE_OTHER): Payer: Self-pay | Admitting: Orthopaedic Surgery

## 2017-12-07 NOTE — Telephone Encounter (Signed)
What to do? Just work her in next week I guess?

## 2017-12-07 NOTE — Telephone Encounter (Signed)
Can you do me a favor and just work her in Gil's schedule next week? That's really the only thing we can do at this point is just SEE her in the office Thanks girl!

## 2017-12-07 NOTE — Telephone Encounter (Signed)
Patient request a call back from [place] regarding the sever pain she's still having in her right knee after having right hip surgery.

## 2017-12-07 NOTE — Telephone Encounter (Signed)
Have Bronson CurbGil see her next week.

## 2017-12-07 NOTE — Telephone Encounter (Signed)
appt made for Tuesday at 8:15

## 2017-12-10 ENCOUNTER — Telehealth (INDEPENDENT_AMBULATORY_CARE_PROVIDER_SITE_OTHER): Payer: Self-pay | Admitting: Orthopaedic Surgery

## 2017-12-10 ENCOUNTER — Other Ambulatory Visit (INDEPENDENT_AMBULATORY_CARE_PROVIDER_SITE_OTHER): Payer: Self-pay | Admitting: Physician Assistant

## 2017-12-10 MED ORDER — HYDROCODONE-ACETAMINOPHEN 10-325 MG PO TABS: 1.0000 | ORAL_TABLET | Freq: Four times a day (QID) | ORAL | 0 refills | Status: DC | PRN

## 2017-12-10 NOTE — Telephone Encounter (Signed)
Please advise 

## 2017-12-10 NOTE — Telephone Encounter (Signed)
Patient aware ready at the front desk

## 2017-12-10 NOTE — Telephone Encounter (Signed)
Patient called wanting refill for Hydrocodone. Stated she is out.  Please call patient to advise.

## 2017-12-10 NOTE — Telephone Encounter (Signed)
On your desk

## 2017-12-11 ENCOUNTER — Ambulatory Visit (INDEPENDENT_AMBULATORY_CARE_PROVIDER_SITE_OTHER): Payer: Medicaid Other | Admitting: Physician Assistant

## 2017-12-12 ENCOUNTER — Ambulatory Visit: Payer: Medicaid Other | Admitting: Physical Therapy

## 2017-12-17 ENCOUNTER — Ambulatory Visit: Payer: Medicaid Other | Admitting: Physical Therapy

## 2017-12-19 ENCOUNTER — Ambulatory Visit (INDEPENDENT_AMBULATORY_CARE_PROVIDER_SITE_OTHER): Payer: Medicaid Other | Admitting: Orthopaedic Surgery

## 2017-12-19 ENCOUNTER — Encounter (INDEPENDENT_AMBULATORY_CARE_PROVIDER_SITE_OTHER): Payer: Self-pay | Admitting: Orthopaedic Surgery

## 2017-12-19 DIAGNOSIS — Z96641 Presence of right artificial hip joint: Secondary | ICD-10-CM

## 2017-12-19 MED ORDER — HYDROCODONE-ACETAMINOPHEN 10-325 MG PO TABS: 1.0000 | ORAL_TABLET | Freq: Four times a day (QID) | ORAL | 0 refills | Status: DC | PRN

## 2017-12-19 NOTE — Progress Notes (Signed)
The patient will be 8 weeks this Friday status post a right total hip arthroplasty.  She is ambulate with a cane.  She is someone who is morbidly obese with a body mass index of almost 52.  She is still been dealing with chronic pain as well having been on narcotics for a long period time.  She is working on Audiological scientistweaning process.  She is ambulate a cane and still struggling daily with pain.  On exam her right hip incision looks good and very pleased with how the incision itself looks.  I can put her right hip and right knee through range of motion pretty good.  I have still encouraged her as much as possible wean from narcotics.  We will continue her hydrocodone but we want to try her to wean these medications over the next 4 to 6 weeks.

## 2017-12-24 ENCOUNTER — Ambulatory Visit: Payer: Medicaid Other | Admitting: Physical Therapy

## 2017-12-28 ENCOUNTER — Telehealth (INDEPENDENT_AMBULATORY_CARE_PROVIDER_SITE_OTHER): Payer: Self-pay | Admitting: Orthopaedic Surgery

## 2017-12-28 MED ORDER — HYDROCODONE-ACETAMINOPHEN 10-325 MG PO TABS: 1.0000 | ORAL_TABLET | Freq: Four times a day (QID) | ORAL | 0 refills | Status: DC | PRN

## 2017-12-28 NOTE — Telephone Encounter (Signed)
noted 

## 2017-12-28 NOTE — Telephone Encounter (Signed)
Please advise 

## 2017-12-28 NOTE — Telephone Encounter (Signed)
Med refill  °Hydrocodone  ° °

## 2017-12-28 NOTE — Telephone Encounter (Signed)
I sent some in 

## 2018-01-02 ENCOUNTER — Encounter: Payer: Self-pay | Admitting: Physical Therapy

## 2018-01-02 NOTE — Therapy (Signed)
[place], [place], [address] Phone: [phone]   Fax:  [phone]  Patient Details  Name: Savina Olshefski MRN: [id number] Date of Birth: [birth year] Referring Provider:  No ref. provider found  Encounter Date: 01/02/2018  PHYSICAL THERAPY DISCHARGE SUMMARY  Visits from Start of Care: 1  Current functional level related to goals / functional outcomes: Has not returned since last scheduled session- DC    Remaining deficits: Unable to assess    Education / Equipment: N/A  Plan: Patient agrees to discharge.  Patient goals were not met. Patient is being discharged due to not returning since the last visit.  ?????     Deniece Ree PT, DPT, CBIS  Supplemental Physical Therapist [place]   Pager [facility] [address] [place], [place], [address] Phone: [phone]   Fax:  [phone]

## 2018-01-07 ENCOUNTER — Other Ambulatory Visit (INDEPENDENT_AMBULATORY_CARE_PROVIDER_SITE_OTHER): Payer: Self-pay | Admitting: Orthopaedic Surgery

## 2018-01-07 ENCOUNTER — Telehealth (INDEPENDENT_AMBULATORY_CARE_PROVIDER_SITE_OTHER): Payer: Self-pay | Admitting: Orthopaedic Surgery

## 2018-01-07 MED ORDER — HYDROCODONE-ACETAMINOPHEN 10-325 MG PO TABS: 1.0000 | ORAL_TABLET | Freq: Three times a day (TID) | ORAL | 0 refills | Status: DC | PRN

## 2018-01-07 NOTE — Telephone Encounter (Signed)
Patient left voicemail requesting refill of hydrocodone that she would be out today. Patients # [phone]

## 2018-01-07 NOTE — Telephone Encounter (Signed)
Please advise 

## 2018-01-07 NOTE — Telephone Encounter (Signed)
I sent some more into the pharmacy.  Please contact her and let her know that she really needs to wean at this point because I would not be comfortable providing these after another 4 weeks.

## 2018-01-08 NOTE — Telephone Encounter (Signed)
LMOM for patient of the below message  

## 2018-01-08 NOTE — Telephone Encounter (Signed)
Patient just wants you to know for her appt next week she is pushing as much as possible through the pain, she states she is still having a lot of "real" pain and also with her knee. She states she has felt something "just wasn't right" this whole time. She wants to discuss this at her appt

## 2018-01-08 NOTE — Telephone Encounter (Signed)
Pt called back would like to speak with Morrie SheldonAshley

## 2018-01-16 ENCOUNTER — Encounter (INDEPENDENT_AMBULATORY_CARE_PROVIDER_SITE_OTHER): Payer: Self-pay | Admitting: Orthopaedic Surgery

## 2018-01-16 ENCOUNTER — Ambulatory Visit (INDEPENDENT_AMBULATORY_CARE_PROVIDER_SITE_OTHER): Payer: Medicaid Other | Admitting: Orthopaedic Surgery

## 2018-01-16 DIAGNOSIS — M25562 Pain in left knee: Secondary | ICD-10-CM | POA: Diagnosis not present

## 2018-01-16 DIAGNOSIS — M25561 Pain in right knee: Secondary | ICD-10-CM | POA: Diagnosis not present

## 2018-01-16 DIAGNOSIS — Z96641 Presence of right artificial hip joint: Secondary | ICD-10-CM

## 2018-01-16 DIAGNOSIS — G8929 Other chronic pain: Secondary | ICD-10-CM

## 2018-01-16 MED ORDER — METHYLPREDNISOLONE ACETATE 40 MG/ML IJ SUSP
40.0000 mg | INTRAMUSCULAR | Status: AC | PRN
  Administered 2018-01-16: 40 mg via INTRA_ARTICULAR

## 2018-01-16 MED ORDER — HYDROCODONE-ACETAMINOPHEN 10-325 MG PO TABS: 1.0000 | ORAL_TABLET | Freq: Three times a day (TID) | ORAL | 0 refills | Status: DC | PRN

## 2018-01-16 MED ORDER — LIDOCAINE HCL 1 % IJ SOLN
3.0000 mL | INTRAMUSCULAR | Status: AC | PRN
  Administered 2018-01-16: 3 mL

## 2018-01-16 NOTE — Progress Notes (Signed)
Office Visit Note   Patient: Valerie Barton           Date of Birth: [birth year]           MRN: [id number] Visit Date: 01/16/2018              Requested by: Verlon AuBoyd, Tammy Lamonica, MD [address] [place], [place] [id number] PCP: Verlon AuBoyd, Tammy Lamonica, MD   Assessment & Plan: Visit Diagnoses:  1. Status post total replacement of right hip   2. Chronic pain of right knee   3. Chronic pain of left knee     Plan: I did provide steroid injections in both knees today.  I would like to see her back now for 3 months.  At that visit I would like an AP and lateral of the right hip and AP and lateral both knees.  I will refill her hydrocodone and she knows that we need to have her wean this over the next 4 weeks because I would not provide it after that.  Follow-Up Instructions: Return in about 3 months (around 04/18/2018).   Orders:  Orders Placed This Encounter  Procedures  . Large Joint Inj  . Large Joint Inj   No orders of the defined types were placed in this encounter.     Procedures: Large Joint Inj: R knee on 01/16/2018 2:34 PM Indications: diagnostic evaluation and pain Details: 22 G 1.5 in needle, superolateral approach  Arthrogram: No  Medications: 3 mL lidocaine 1 %; 40 mg methylPREDNISolone acetate 40 MG/ML Outcome: tolerated well, no immediate complications Procedure, treatment alternatives, risks and benefits explained, specific risks discussed. Consent was given by the patient. Immediately prior to procedure a time out was called to verify the correct patient, procedure, equipment, support staff and site/side marked as required. Patient was prepped and draped in the usual sterile fashion.   Large Joint Inj: L knee on 01/16/2018 2:35 PM Indications: diagnostic evaluation and pain Details: 22 G 1.5 in needle, superolateral approach  Arthrogram: No  Medications: 3 mL lidocaine 1 %; 40 mg methylPREDNISolone acetate 40 MG/ML Outcome: tolerated well, no  immediate complications Procedure, treatment alternatives, risks and benefits explained, specific risks discussed. Consent was given by the patient. Immediately prior to procedure a time out was called to verify the correct patient, procedure, equipment, support staff and site/side marked as required. Patient was prepped and draped in the usual sterile fashion.       Clinical Data: No additional findings.   Subjective: Chief Complaint  Patient presents with  . Right Hip - Routine Post Op    10/26/17 right total hip arthroplasty, anterior approach  Patient is well-known to me.  Next week she will be 3 months status post a right total hip arthroplasty.  She is also been dealing with chronic pain is been on hydrocodone for very long period of time.  She denies were trying to wean her from this.  She is morbidly obese individual with a BMI of 52.  She tolerated the hip ablation surgery well.  She does have chronic low back pain now bilateral knee pain.  HPI  Review of Systems She currently denies any headache, chest pain, shortness of breath, fever, chills, nausea, vomiting.  Objective: Vital Signs: There were no vitals taken for this visit.  Physical Exam She is alert and oriented x3 and in no acute distress.  She is able with a cane. Ortho Exam Examination of her right hip moves fluidly.  There is not significant pain with her hip at all.  Incisions healed nicely.  There is some pain of the trochanteric area.  Both knees have patellofemoral crepitation but good range of motion. Specialty Comments:  No specialty comments available.  Imaging: No results found.   PMFS History: Patient Active Problem List   Diagnosis Date Noted  . Status post total replacement of right hip 10/26/2017  . Unilateral primary osteoarthritis, right hip 12/28/2016  . Pain in right hip 12/28/2016   Past Medical History:  Diagnosis Date  . Anxiety    some anxiety attacks 20 years ago   . Chronic back  pain   . GERD (gastroesophageal reflux disease)    on occ   . Heart palpitations    patietn states this is a famailial trait ; palpitations are occassional , more frequent with caffeine or if taking psudafed   . HGSIL Pap smear of vagina   . Hypertension   . Morbid obesity (HCC)   . OA (osteoarthritis) of hip    Right  . Wears dentures     History reviewed. No pertinent family history.  Past Surgical History:  Procedure Laterality Date  . CHOLECYSTECTOMY  07/1984  . FOOT SURGERY Bilateral 2003   may have been bone spurs on small toe   . TOTAL HIP ARTHROPLASTY Right 10/26/2017   Procedure: RIGHT TOTAL HIP ARTHROPLASTY ANTERIOR APPROACH;  Surgeon: Kathryne Hitch, MD;  Location: [facility];  Service: Orthopedics;  Laterality: Right;   Social History   Occupational History  . Not on file  Tobacco Use  . Smoking status: Current Every Day Smoker    Years: 4.00    Types: Cigarettes  . Smokeless tobacco: Never Used  Substance and Sexual Activity  . Alcohol use: Not Currently  . Drug use: Never  . Sexual activity: Not on file

## 2018-01-30 ENCOUNTER — Telehealth (INDEPENDENT_AMBULATORY_CARE_PROVIDER_SITE_OTHER): Payer: Self-pay | Admitting: Orthopaedic Surgery

## 2018-01-30 MED ORDER — HYDROCODONE-ACETAMINOPHEN 10-325 MG PO TABS: 1.0000 | ORAL_TABLET | Freq: Three times a day (TID) | ORAL | 0 refills | Status: DC | PRN

## 2018-01-30 NOTE — Telephone Encounter (Signed)
Patient left a vm message requesting a RX refill on her Narco.  CB#[phone].  Thank you.

## 2018-01-30 NOTE — Telephone Encounter (Signed)
Please advise 

## 2018-01-30 NOTE — Telephone Encounter (Signed)
I sent some more in.  She needs to try to ween.

## 2018-02-11 ENCOUNTER — Telehealth (INDEPENDENT_AMBULATORY_CARE_PROVIDER_SITE_OTHER): Payer: Self-pay | Admitting: Orthopaedic Surgery

## 2018-02-11 NOTE — Telephone Encounter (Signed)
Dr. Magnus IvanBlackman said no more Norco see July note could do tramaold 50mg  one po q6  #40 zero

## 2018-02-11 NOTE — Telephone Encounter (Signed)
Patient called needing Rx refilled (Hydrocodone) The number to contact patient is [phone]

## 2018-02-11 NOTE — Telephone Encounter (Signed)
Please advise 

## 2018-02-12 MED ORDER — HYDROCODONE-ACETAMINOPHEN 10-325 MG PO TABS: 1.0000 | ORAL_TABLET | Freq: Three times a day (TID) | ORAL | 0 refills | Status: DC | PRN

## 2018-02-12 NOTE — Telephone Encounter (Signed)
Patient is asking for one more refill She states she has been on this for years and she is down to 1 1/2-2 a day and she's doing well and thinks one more refill should be good and enough to completely wean off

## 2018-02-12 NOTE — Telephone Encounter (Signed)
I sent in just one more.

## 2018-02-13 NOTE — Telephone Encounter (Signed)
Patient aware.

## 2018-02-25 ENCOUNTER — Ambulatory Visit (INDEPENDENT_AMBULATORY_CARE_PROVIDER_SITE_OTHER): Payer: Medicaid Other

## 2018-02-25 ENCOUNTER — Encounter (INDEPENDENT_AMBULATORY_CARE_PROVIDER_SITE_OTHER): Payer: Self-pay | Admitting: Physician Assistant

## 2018-02-25 ENCOUNTER — Ambulatory Visit (INDEPENDENT_AMBULATORY_CARE_PROVIDER_SITE_OTHER): Payer: Self-pay

## 2018-02-25 ENCOUNTER — Other Ambulatory Visit (INDEPENDENT_AMBULATORY_CARE_PROVIDER_SITE_OTHER): Payer: Self-pay

## 2018-02-25 ENCOUNTER — Ambulatory Visit (INDEPENDENT_AMBULATORY_CARE_PROVIDER_SITE_OTHER): Payer: Medicaid Other | Admitting: Physician Assistant

## 2018-02-25 DIAGNOSIS — G8929 Other chronic pain: Secondary | ICD-10-CM

## 2018-02-25 DIAGNOSIS — M25561 Pain in right knee: Secondary | ICD-10-CM | POA: Diagnosis not present

## 2018-02-25 DIAGNOSIS — M25562 Pain in left knee: Secondary | ICD-10-CM

## 2018-02-25 DIAGNOSIS — M25551 Pain in right hip: Secondary | ICD-10-CM | POA: Diagnosis not present

## 2018-02-25 DIAGNOSIS — M545 Low back pain: Secondary | ICD-10-CM

## 2018-02-25 MED ORDER — HYDROCODONE-ACETAMINOPHEN 5-325 MG PO TABS: 1.0000 | ORAL_TABLET | ORAL | 0 refills | Status: AC | PRN

## 2018-02-25 MED ORDER — METHYLPREDNISOLONE ACETATE 40 MG/ML IJ SUSP
40.0000 mg | INTRAMUSCULAR | Status: AC | PRN
Start: 2018-02-25 — End: 2018-02-25
  Administered 2018-02-25: 40 mg via INTRA_ARTICULAR

## 2018-02-25 MED ORDER — LIDOCAINE HCL 1 % IJ SOLN
3.0000 mL | INTRAMUSCULAR | Status: AC | PRN
  Administered 2018-02-25: 3 mL

## 2018-02-25 MED ORDER — DICLOFENAC SODIUM 75 MG PO TBEC: 75.0000 mg | DELAYED_RELEASE_TABLET | Freq: Two times a day (BID) | ORAL | 1 refills | Status: AC

## 2018-02-25 NOTE — Progress Notes (Signed)
Office Visit Note   Patient: Valerie Barton           Date of Birth: 1956-01-06           MRN: [id number] Visit Date: 02/25/2018              Requested by: Verlon Au, MD [address] [place], [place] [address] PCP: Verlon Au, MD   Assessment & Plan: Visit Diagnoses:  1. Chronic pain of both knees   2. Pain in right hip   3. Low back pain, unspecified back pain laterality, unspecified chronicity, with sciatica presence unspecified     Plan: We will send her to therapy for back exercises, IT band stretching, quad strengthening, home exercise program and modalities.  She is to stop all other NSAIDs and start diclofenac.  If she requires further clinics will need referral to pain management.  Follow-Up Instructions: Return in about 6 weeks (around 04/08/2018).   Orders:  Orders Placed This Encounter  Procedures  . Large Joint Inj: R greater trochanter  . XR Knee 1-2 Views Left  . XR Knee 1-2 Views Right  . XR Lumbar Spine 2-3 Views  . XR HIP UNILAT W OR W/O PELVIS 2-3 VIEWS RIGHT   Meds ordered this encounter  Medications  . HYDROcodone-acetaminophen (NORCO) 5-325 MG tablet    Sig: Take 1 tablet by mouth every 4 (four) hours as needed for moderate pain. One to two tabs every 4-6 hours for pain    Dispense:  40 tablet    Refill:  0  . diclofenac (VOLTAREN) 75 MG EC tablet    Sig: Take 1 tablet (75 mg total) by mouth 2 (two) times daily.    Dispense:  60 tablet    Refill:  1      Procedures: Large Joint Inj: R greater trochanter on 02/25/2018 11:46 AM Indications: pain Details: 22 G 1.5 in needle, lateral approach  Arthrogram: No  Medications: 3 mL lidocaine 1 %; 40 mg methylPREDNISolone acetate 40 MG/ML Outcome: tolerated well, no immediate complications Procedure, treatment alternatives, risks and benefits explained, specific risks discussed. Consent was given by the patient. Immediately prior to procedure a time out was called  to verify the correct patient, procedure, equipment, support staff and site/side marked as required. Patient was prepped and draped in the usual sterile fashion.       Clinical Data: No additional findings.   Subjective: Chief Complaint  Patient presents with  . Left Knee - Pain  . Right Knee - Pain  . Lower Back - Pain  . Right Hip - Pain    HPI Valerie Barton returns today complaining of debilitating pain.  She is having pain in both her low back and her right hip.  She is 4 months status post right total hip arthroplasty.  She does state that the cortisone injections in both knees helped the left greater than the right.  She still have some pain in the medial aspect of the right knee.  She denies any bowel or bladder dysfunction.  She has some burning sensation in her low back.  Low back pain does awaken her.  No numbness tingling radicular symptoms down either leg.  She does feel her leg lengths are off.  She walks without her cane at home and with a cane outside of the home.  She states she does not feel secure and anti-sense of awareness outside the home even with the use of a  cane.  iReview of Systems Denies fevers chills chest pain shortness of breath.  Object ive: Vital Signs: There were no vitals taken for this visit.  Physical Exam  Constitutional: She is oriented to person, place, and time. She appears well-developed and well-nourished. No distress.  Cardiovascular: Intact distal pulses.  Pulmonary/Chest: Effort normal.  Neurological: She is alert and oriented to person, place, and time.  Skin: She is not diaphoretic.    Ortho Exam Good range of motion of both hips.  Slight discomfort with external rotation of the right hip.  Tenderness over the right trochanteric region.  Tenderness down the entire right IT band.  5 out of 5 strength throughout lower extremities against resistance.  Negative straight leg raise bilaterally.  Tenderness lower lumbar spine.  Sensation  intact bilateral feet to light touch.  Calf supple nontender bilaterally Specialty Comments:  No specialty comments available.  Imaging: Xr Hip Unilat W Or W/o Pelvis 2-3 Views Right  Result Date: 02/25/2018 Right hip AP lateral and AP pelvis: No acute fractures.  Both hips are well located.  Left hip is well-preserved.  Right total hip arthroplasty components well-seated no signs of failure.  Xr Knee 1-2 Views Left  Result Date: 02/25/2018 AP lateral views left knee: No acute fracture.  Moderate severe medial compartmental narrowing.  Mild to moderate patellofemoral changes.  Knee is otherwise well located.  Xr Knee 1-2 Views Right  Result Date: 02/25/2018 Right knee AP lateral views: No acute fracture.  Moderate severe medial compartmental narrowing.  Patellofemoral compartment with moderate to severe changes.  Knee is well located.  Xr Lumbar Spine 2-3 Views  Result Date: 02/25/2018 Lumbar spine: No acute fractures.  Grade 1L4 on 5 spondylolisthesis.  Facet degenerative changes.  Normal lordotic curvature.    PMFS History: Patient Active Problem List   Diagnosis Date Noted  . Status post total replacement of right hip 10/26/2017  . Unilateral primary osteoarthritis, right hip 12/28/2016  . Pain in right hip 12/28/2016   Past Medical History:  Diagnosis Date  . Anxiety    some anxiety attacks 20 years ago   . Chronic back pain   . GERD (gastroesophageal reflux disease)    on occ   . Heart palpitations    patietn states this is a famailial trait ; palpitations are occassional , more frequent with caffeine or if taking psudafed   . HGSIL Pap smear of vagina   . Hypertension   . Morbid obesity (HCC)   . OA (osteoarthritis) of hip    Right  . Wears dentures     History reviewed. No pertinent family history.  Past Surgical History:  Procedure Laterality Date  . CHOLECYSTECTOMY  07/1984  . FOOT SURGERY Bilateral 2003   may have been bone spurs on small toe   . TOTAL  HIP ARTHROPLASTY Right 10/26/2017   Procedure: RIGHT TOTAL HIP ARTHROPLASTY ANTERIOR APPROACH;  Surgeon: Kathryne HitchBlackman, Christopher Y, MD;  Location: [facility];  Service: Orthopedics;  Laterality: Right;   Social History   Occupational History  . Not on file  Tobacco Use  . Smoking status: Current Every Day Smoker    Years: 4.00    Types: Cigarettes  . Smokeless tobacco: Never Used  Substance and Sexual Activity  . Alcohol use: Not Currently  . Drug use: Never  . Sexual activity: Not on file

## 2018-03-05 ENCOUNTER — Other Ambulatory Visit (INDEPENDENT_AMBULATORY_CARE_PROVIDER_SITE_OTHER): Payer: Self-pay | Admitting: Physician Assistant

## 2018-03-05 NOTE — Telephone Encounter (Signed)
Patient called needing Rx refilled (Hydrocodone) The number to contact patient is [phone]

## 2018-03-05 NOTE — Telephone Encounter (Signed)
Please advise 

## 2018-03-05 NOTE — Telephone Encounter (Signed)
She just got this last week from [place].  I had wanted her to ween from this.  Does she need referral to Pain Management, because I need her off of the narcotics.

## 2018-03-06 ENCOUNTER — Ambulatory Visit: Payer: Self-pay | Admitting: Podiatry

## 2018-03-06 NOTE — Telephone Encounter (Signed)
She said you guys discussed going down on dosage not to quit completely  She states she is still in a considerable amount of pain

## 2018-03-06 NOTE — Telephone Encounter (Signed)
We could try tramadol but I told her if she needed further narcotics we have to send her to pain management and recommend that at this time.  Tramadol 50 mg 1 p.o. every 6 hours #40 with no refills

## 2018-03-06 NOTE — Telephone Encounter (Signed)
Patient called in for RX she called in yesterday. Advised we had sent to Dr. Magnus Ivan. Didn't read notes to patient all except Dr Magnus Ivan wanted her off these meds.  Please call patient to advise.

## 2018-03-07 MED ORDER — TRAMADOL HCL 50 MG PO TABS: 50.0000 mg | ORAL_TABLET | Freq: Four times a day (QID) | ORAL | 0 refills | Status: DC | PRN

## 2018-03-07 NOTE — Telephone Encounter (Signed)
IC tramadol into her pharmacy

## 2018-03-11 ENCOUNTER — Ambulatory Visit: Payer: Medicaid Other | Admitting: Physical Therapy

## 2018-03-13 ENCOUNTER — Ambulatory Visit: Payer: Self-pay | Admitting: Podiatry

## 2018-03-19 ENCOUNTER — Telehealth (INDEPENDENT_AMBULATORY_CARE_PROVIDER_SITE_OTHER): Payer: Self-pay | Admitting: Physician Assistant

## 2018-03-19 MED ORDER — TRAMADOL HCL 50 MG PO TABS: 50.0000 mg | ORAL_TABLET | Freq: Four times a day (QID) | ORAL | 0 refills | Status: DC | PRN

## 2018-03-19 NOTE — Telephone Encounter (Signed)
I sent some Tramadol in to her pharmacy.

## 2018-03-19 NOTE — Telephone Encounter (Signed)
Patient called and request refill for Tramadol.  Please call patient to advise.

## 2018-03-19 NOTE — Telephone Encounter (Signed)
Please advise 

## 2018-03-29 ENCOUNTER — Ambulatory Visit: Payer: Self-pay | Admitting: Podiatry

## 2018-04-01 ENCOUNTER — Ambulatory Visit: Payer: Self-pay | Admitting: Podiatry

## 2018-04-02 ENCOUNTER — Ambulatory Visit: Payer: Medicaid Other | Admitting: Physical Therapy

## 2018-04-02 ENCOUNTER — Other Ambulatory Visit (INDEPENDENT_AMBULATORY_CARE_PROVIDER_SITE_OTHER): Payer: Self-pay | Admitting: Orthopaedic Surgery

## 2018-04-02 NOTE — Telephone Encounter (Signed)
Please advise 

## 2018-04-08 ENCOUNTER — Ambulatory Visit (INDEPENDENT_AMBULATORY_CARE_PROVIDER_SITE_OTHER): Payer: Medicaid Other | Admitting: Physician Assistant

## 2018-04-12 ENCOUNTER — Other Ambulatory Visit (INDEPENDENT_AMBULATORY_CARE_PROVIDER_SITE_OTHER): Payer: Self-pay | Admitting: Orthopaedic Surgery

## 2018-04-12 NOTE — Telephone Encounter (Signed)
Please advise 

## 2018-04-15 ENCOUNTER — Ambulatory Visit (INDEPENDENT_AMBULATORY_CARE_PROVIDER_SITE_OTHER): Payer: Medicaid Other | Admitting: Physician Assistant

## 2018-04-18 ENCOUNTER — Ambulatory Visit (INDEPENDENT_AMBULATORY_CARE_PROVIDER_SITE_OTHER): Payer: Medicaid Other | Admitting: Orthopaedic Surgery

## 2018-04-22 ENCOUNTER — Encounter (INDEPENDENT_AMBULATORY_CARE_PROVIDER_SITE_OTHER): Payer: Self-pay | Admitting: Physician Assistant

## 2018-04-22 ENCOUNTER — Ambulatory Visit (INDEPENDENT_AMBULATORY_CARE_PROVIDER_SITE_OTHER): Payer: Medicaid Other | Admitting: Physician Assistant

## 2018-04-22 DIAGNOSIS — M545 Low back pain, unspecified: Secondary | ICD-10-CM

## 2018-04-22 DIAGNOSIS — G8929 Other chronic pain: Secondary | ICD-10-CM

## 2018-04-22 DIAGNOSIS — M25551 Pain in right hip: Secondary | ICD-10-CM | POA: Diagnosis not present

## 2018-04-22 MED ORDER — TRAMADOL HCL 50 MG PO TABS: 50.0000 mg | ORAL_TABLET | Freq: Four times a day (QID) | ORAL | 0 refills | Status: DC | PRN

## 2018-04-22 NOTE — Progress Notes (Signed)
Office Visit Note   Patient: Valerie Barton           Date of Birth: 26-Aug-1955           MRN: [id number] Visit Date: 04/22/2018              Requested by: Verlon Au, MD [address] [place], [place] [address] PCP: Verlon Au, MD   Assessment & Plan: Visit Diagnoses:  1. Chronic right-sided low back pain without sciatica   2. Pain in right hip     Plan: We will order an MRI of her lumbar spine to rule out HNP as a source of her low back pain and hip pain on the right.  She is given a refill on tramadol.  Questions encouraged and answered at length.  Follow-Up Instructions: Return for after MRI.   Orders:  No orders of the defined types were placed in this encounter.  No orders of the defined types were placed in this encounter.     Procedures: No procedures performed   Clinical Data: No additional findings.   Subjective: Chief Complaint  Patient presents with  . Lower Back - Follow-up  . Right Hip - Follow-up    HPI Valerie Barton 62 year old female who returns today after a stroke injection on the right on 02/25/2018 unsure if this really helped.  She unfortunately did not get a physical therapy due to the fact that her daughter was involved in a severe car accident and she is now caring for her granddaughter's point time.  She continues to have pain in her low back if she stands for long period of time.  She has stiffness right thigh whenever she first gets up and begins to ambulate.  She is however now able to ambulate without a cane whenever she is out of the home.  She has muscle pain around the right thigh incision area.  She denies any bowel bladder dysfunction.  Denies any waking pain.  She did try some diclofenac which caused her to become dizzy nauseated she discontinued it.  Review of Systems Please see HPI otherwise negative  Objective: Vital Signs: There were no vitals taken for this visit.  Physical Exam    Constitutional: She is oriented to person, place, and time. She appears well-developed and well-nourished. No distress.  Pulmonary/Chest: Effort normal.  Neurological: She is alert and oriented to person, place, and time.  Skin: She is not diaphoretic.    Ortho Exam Physical exam 5 out of 5 strength lower extremities against resistance.  Negative straight leg raise on the left positive on the right.  She has tenderness over the right lower lumbar paraspinous region.  Good range of motion bilateral hips without pain.  Tenderness over the right trochanteric region minimal tenderness over the left trochanteric region. Specialty Comments:  No specialty comments available.  Imaging: No results found.   PMFS History: Patient Active Problem List   Diagnosis Date Noted  . Status post total replacement of right hip 10/26/2017  . Unilateral primary osteoarthritis, right hip 12/28/2016  . Pain in right hip 12/28/2016   Past Medical History:  Diagnosis Date  . Anxiety    some anxiety attacks 20 years ago   . Chronic back pain   . GERD (gastroesophageal reflux disease)    on occ   . Heart palpitations    patietn states this is a famailial trait ; palpitations are occassional , more frequent with caffeine or  if taking psudafed   . HGSIL Pap smear of vagina   . Hypertension   . Morbid obesity (HCC)   . OA (osteoarthritis) of hip    Right  . Wears dentures     No family history on file.  Past Surgical History:  Procedure Laterality Date  . CHOLECYSTECTOMY  07/1984  . FOOT SURGERY Bilateral 2003   may have been bone spurs on small toe   . TOTAL HIP ARTHROPLASTY Right 10/26/2017   Procedure: RIGHT TOTAL HIP ARTHROPLASTY ANTERIOR APPROACH;  Surgeon: Kathryne Hitch, MD;  Location: [facility];  Service: Orthopedics;  Laterality: Right;   Social History   Occupational History  . Not on file  Tobacco Use  . Smoking status: Current Every Day Smoker    Years: 4.00    Types:  Cigarettes  . Smokeless tobacco: Never Used  Substance and Sexual Activity  . Alcohol use: Not Currently  . Drug use: Never  . Sexual activity: Not on file

## 2018-05-02 ENCOUNTER — Other Ambulatory Visit (INDEPENDENT_AMBULATORY_CARE_PROVIDER_SITE_OTHER): Payer: Self-pay | Admitting: Physician Assistant

## 2018-05-02 NOTE — Telephone Encounter (Signed)
Please advise 

## 2018-05-11 ENCOUNTER — Other Ambulatory Visit (INDEPENDENT_AMBULATORY_CARE_PROVIDER_SITE_OTHER): Payer: Self-pay | Admitting: Orthopaedic Surgery

## 2018-05-11 ENCOUNTER — Inpatient Hospital Stay: Admission: RE | Admit: 2018-05-11 | Payer: Medicaid Other | Source: Ambulatory Visit

## 2018-05-13 NOTE — Telephone Encounter (Signed)
Please advise 

## 2018-05-20 ENCOUNTER — Ambulatory Visit (INDEPENDENT_AMBULATORY_CARE_PROVIDER_SITE_OTHER): Payer: Medicaid Other | Admitting: Physician Assistant

## 2018-05-23 ENCOUNTER — Other Ambulatory Visit (INDEPENDENT_AMBULATORY_CARE_PROVIDER_SITE_OTHER): Payer: Self-pay | Admitting: Orthopaedic Surgery

## 2018-05-23 ENCOUNTER — Other Ambulatory Visit: Payer: Medicaid Other

## 2018-05-23 NOTE — Telephone Encounter (Signed)
Please advise 

## 2018-05-29 ENCOUNTER — Ambulatory Visit (INDEPENDENT_AMBULATORY_CARE_PROVIDER_SITE_OTHER): Payer: Medicaid Other | Admitting: Physician Assistant

## 2018-06-02 ENCOUNTER — Other Ambulatory Visit: Payer: Medicaid Other

## 2018-06-04 ENCOUNTER — Other Ambulatory Visit (INDEPENDENT_AMBULATORY_CARE_PROVIDER_SITE_OTHER): Payer: Self-pay | Admitting: Orthopaedic Surgery

## 2018-06-04 NOTE — Telephone Encounter (Signed)
Please advise 

## 2018-06-05 ENCOUNTER — Ambulatory Visit (INDEPENDENT_AMBULATORY_CARE_PROVIDER_SITE_OTHER): Payer: Medicaid Other | Admitting: Physician Assistant

## 2018-06-11 ENCOUNTER — Other Ambulatory Visit: Payer: Medicaid Other

## 2018-06-13 ENCOUNTER — Ambulatory Visit (INDEPENDENT_AMBULATORY_CARE_PROVIDER_SITE_OTHER): Payer: Medicaid Other | Admitting: Physician Assistant

## 2018-06-13 ENCOUNTER — Telehealth (INDEPENDENT_AMBULATORY_CARE_PROVIDER_SITE_OTHER): Payer: Self-pay | Admitting: Orthopaedic Surgery

## 2018-06-13 MED ORDER — TRAMADOL HCL 50 MG PO TABS: 50.0000 mg | ORAL_TABLET | Freq: Four times a day (QID) | ORAL | 0 refills | Status: AC | PRN

## 2018-06-13 NOTE — Telephone Encounter (Signed)
Patient left a message stating that she will not have her MRI until December 20th and wants to know if she could have one more RX refill on her Tramadol to last until she can see Bronson CurbGil again.  CB#[phone].  Thank you.

## 2018-06-13 NOTE — Telephone Encounter (Signed)
I sent some in 

## 2018-06-13 NOTE — Telephone Encounter (Signed)
Please advise 

## 2018-06-21 ENCOUNTER — Other Ambulatory Visit: Payer: Medicaid Other

## 2018-06-24 ENCOUNTER — Ambulatory Visit (INDEPENDENT_AMBULATORY_CARE_PROVIDER_SITE_OTHER): Payer: Medicaid Other | Admitting: Physician Assistant

## 2018-07-17 ENCOUNTER — Ambulatory Visit (INDEPENDENT_AMBULATORY_CARE_PROVIDER_SITE_OTHER): Payer: Medicaid Other | Admitting: Physician Assistant

## 2018-07-17 ENCOUNTER — Encounter (INDEPENDENT_AMBULATORY_CARE_PROVIDER_SITE_OTHER): Payer: Self-pay | Admitting: Physician Assistant

## 2018-07-17 DIAGNOSIS — M5441 Lumbago with sciatica, right side: Secondary | ICD-10-CM | POA: Diagnosis not present

## 2018-07-17 DIAGNOSIS — G8929 Other chronic pain: Secondary | ICD-10-CM

## 2018-07-17 NOTE — Progress Notes (Signed)
Office Visit Note   Patient: Valerie Barton           Date of Birth: 05/26/1956           MRN: [id number] Visit Date: 07/17/2018              Requested by: Verlon Au, MD [address] [place], [place] [address] PCP: Verlon Au, MD   Assessment & Plan: Visit Diagnoses:  1. Chronic right-sided low back pain with right-sided sciatica     Plan: We will obtain an MRI of her lumbar spine to rule out HNP as a source of pain particularly lateral aspect right hip.  She will follow-up after the MRI to go over results and discuss further treatment.  Questions encouraged and answered at length today.  Follow-Up Instructions: Return for After MRI Dr. Magnus Ivan.   Orders:  No orders of the defined types were placed in this encounter.  No orders of the defined types were placed in this encounter.     Procedures: No procedures performed   Clinical Data: No additional findings.   Subjective: Chief Complaint  Patient presents with  . Lower Back - Follow-up    HPI Valerie Barton 63 year old female well-known to Dr. Magnus Ivan service with a history of right total hip arthroplasty 9 months ago.  She continues to have pain lateral aspect of her right hip down to her knee.  And also she is having low back pain.  She was unable to get the MRI since she was last seen.  She is also complaining of feeling of bloating she felt that this was due to her tramadol she stopped her tramadol still feels bloated.  She does have occasional gastric reflux she states this is very rare.  She has had no bowel bladder dysfunction. Review of Systems Please see HPI otherwise negative  Objective: Vital Signs: There were no vitals taken for this visit.  Physical Exam General well-developed well-nourished female no acute distress mood affect appropriate.  Ambulates without assistive device. Ortho Exam Bilateral hips good range of motion without pain.  She has tenderness over  the lower lumbar and sacral paraspinous region on the right only.  Positive straight leg raise on the right negative on the left.  Tenderness down the right IT band. Specialty Comments:  No specialty comments available.  Imaging: No results found.   PMFS History: Patient Active Problem List   Diagnosis Date Noted  . Status post total replacement of right hip 10/26/2017  . Unilateral primary osteoarthritis, right hip 12/28/2016  . Pain in right hip 12/28/2016   Past Medical History:  Diagnosis Date  . Anxiety    some anxiety attacks 20 years ago   . Chronic back pain   . GERD (gastroesophageal reflux disease)    on occ   . Heart palpitations    patietn states this is a famailial trait ; palpitations are occassional , more frequent with caffeine or if taking psudafed   . HGSIL Pap smear of vagina   . Hypertension   . Morbid obesity (HCC)   . OA (osteoarthritis) of hip    Right  . Wears dentures     History reviewed. No pertinent family history.  Past Surgical History:  Procedure Laterality Date  . CHOLECYSTECTOMY  07/1984  . FOOT SURGERY Bilateral 2003   may have been bone spurs on small toe   . TOTAL HIP ARTHROPLASTY Right 10/26/2017   Procedure: RIGHT TOTAL HIP  ARTHROPLASTY ANTERIOR APPROACH;  Surgeon: Kathryne Hitch, MD;  Location: [facility];  Service: Orthopedics;  Laterality: Right;   Social History   Occupational History  . Not on file  Tobacco Use  . Smoking status: Current Every Day Smoker    Years: 4.00    Types: Cigarettes  . Smokeless tobacco: Never Used  Substance and Sexual Activity  . Alcohol use: Not Currently  . Drug use: Never  . Sexual activity: Not on file

## 2018-07-18 ENCOUNTER — Other Ambulatory Visit (INDEPENDENT_AMBULATORY_CARE_PROVIDER_SITE_OTHER): Payer: Self-pay

## 2018-07-18 DIAGNOSIS — M4807 Spinal stenosis, lumbosacral region: Secondary | ICD-10-CM

## 2018-07-28 ENCOUNTER — Other Ambulatory Visit: Payer: Medicaid Other

## 2018-07-31 ENCOUNTER — Ambulatory Visit (INDEPENDENT_AMBULATORY_CARE_PROVIDER_SITE_OTHER): Payer: Medicaid Other | Admitting: Orthopaedic Surgery

## 2018-08-06 ENCOUNTER — Ambulatory Visit
Admission: RE | Admit: 2018-08-06 | Discharge: 2018-08-06 | Disposition: A | Discharge status: Home / Self Care | Payer: Medicaid Other | Source: Ambulatory Visit | Attending: Physician Assistant | Admitting: Physician Assistant

## 2018-08-06 DIAGNOSIS — M545 Low back pain, unspecified: Secondary | ICD-10-CM

## 2018-08-06 DIAGNOSIS — G8929 Other chronic pain: Secondary | ICD-10-CM

## 2018-08-12 ENCOUNTER — Ambulatory Visit (INDEPENDENT_AMBULATORY_CARE_PROVIDER_SITE_OTHER): Payer: Medicaid Other | Admitting: Orthopaedic Surgery

## 2018-08-26 ENCOUNTER — Ambulatory Visit (INDEPENDENT_AMBULATORY_CARE_PROVIDER_SITE_OTHER): Payer: Medicaid Other | Admitting: Orthopaedic Surgery

## 2018-09-04 ENCOUNTER — Ambulatory Visit (INDEPENDENT_AMBULATORY_CARE_PROVIDER_SITE_OTHER): Payer: Medicaid Other | Admitting: Orthopaedic Surgery

## 2018-10-24 ENCOUNTER — Telehealth (INDEPENDENT_AMBULATORY_CARE_PROVIDER_SITE_OTHER): Payer: Self-pay

## 2018-10-24 NOTE — Telephone Encounter (Signed)
Would like a return call to discuss some concerns.  Did not get MRI results.  Having quite a bit of pain.  Please call [phone].

## 2018-10-24 NOTE — Telephone Encounter (Signed)
Patient aware of below results 

## 2018-10-24 NOTE — Telephone Encounter (Signed)
Patient asking that we call her with her MRI results. States her sciatica feels worse

## 2018-10-24 NOTE — Telephone Encounter (Signed)
Her back MRI does show stenosis and arthritis at several levels.  It is something that we need to see her to show her a spine model which can better explain what she is feeling in her back.  It would be too difficult to try to explain the MRI findings over the phone.  Fortunately though, it does not look like something that we would send her to a spine surgeon to treat.  Usually these types of issues can be treated with seeing someone such as Dr. Alvester Morin for injections in the back.

## 2018-10-28 ENCOUNTER — Ambulatory Visit (INDEPENDENT_AMBULATORY_CARE_PROVIDER_SITE_OTHER): Payer: Medicaid Other | Admitting: Orthopaedic Surgery

## 2018-11-22 ENCOUNTER — Telehealth: Payer: Self-pay | Admitting: Orthopaedic Surgery

## 2018-11-22 NOTE — Telephone Encounter (Signed)
I guess we can state that we are requesting her to be excused from jury duty due to chronic pain that keeps her from sitting for any significant amount of time.  Due to this she is also on chronic narcotic pain medications which certainly can affect judgment.  With that being said we are requesting that she be excused from jury duty.

## 2018-11-22 NOTE — Telephone Encounter (Signed)
See below

## 2018-11-22 NOTE — Telephone Encounter (Signed)
Patient called asked if she can get a note excusing her from jury duty 12/26/2018. Patient need note at least 14 days prior to scheduled date. The number to contact patient is [phone]

## 2018-11-26 NOTE — Telephone Encounter (Signed)
Called and left message with patient letting her know letter will be ready at front desk tomorrow morning

## 2018-12-11 ENCOUNTER — Ambulatory Visit: Payer: Medicaid Other | Admitting: Physician Assistant

## 2018-12-18 ENCOUNTER — Ambulatory Visit: Payer: Medicaid Other | Admitting: Physician Assistant

## 2019-02-12 IMAGING — MR MR LUMBAR SPINE W/O CM
5 series · 45 of 48 positions shown · non-contrast
Comparison: Lumbar radiographs 02/25/2018.

CLINICAL DATA: 62-year-old female with low back pain radiating to
the right thigh and down the right leg for 8-9 months with no known
injury.

EXAM:
MRI LUMBAR SPINE WITHOUT CONTRAST
TECHNIQUE: Multiplanar, multisequence MR imaging of the lumbar spine was
performed. No intravenous contrast was administered.

[Series 3: T2 post-contrast · sagittal · 4.0mm · 0.88mm/px · 4 of 15 slices shown]
[im 1/15]
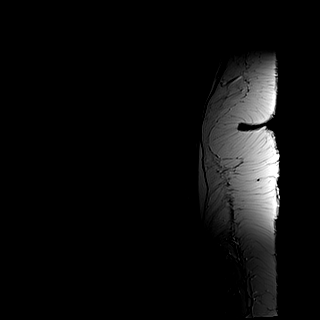
[im 5/15]
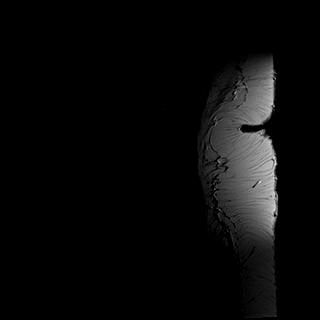
[im 10/15]
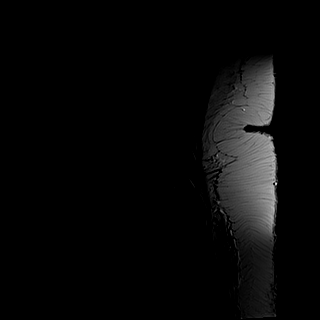
[im 15/15]
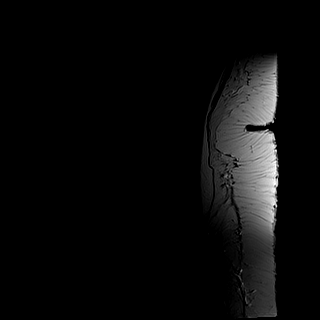

[Series 4: T1 · sagittal · 4.0mm · 0.88mm/px · 5 of 15 slices shown (1 of 2)]
[im 1/15]
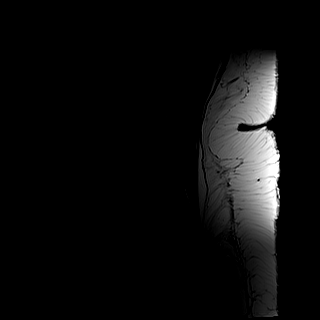
[im 4/15]
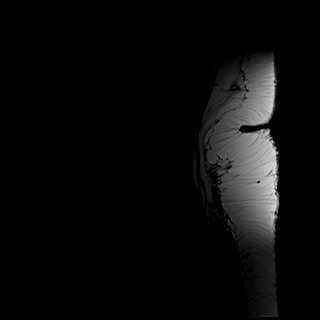
[im 8/15]
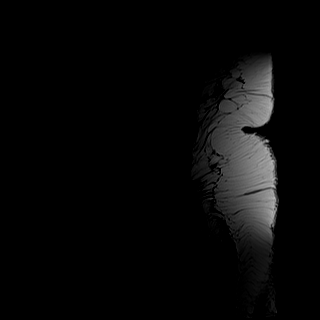
[im 11/15]
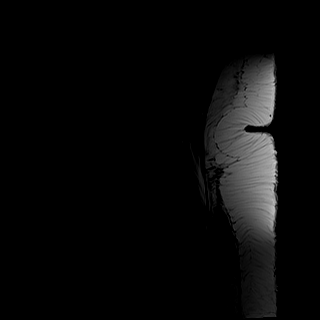
[im 15/15]
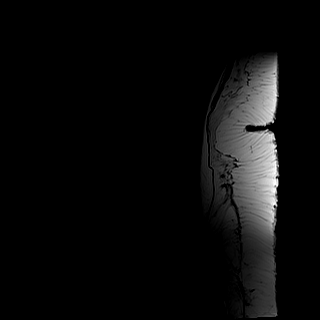

[Series 5: tirm sag · sagittal · 4.0mm · 0.55mm/px · 5 of 15 slices shown]
[im 1/15]
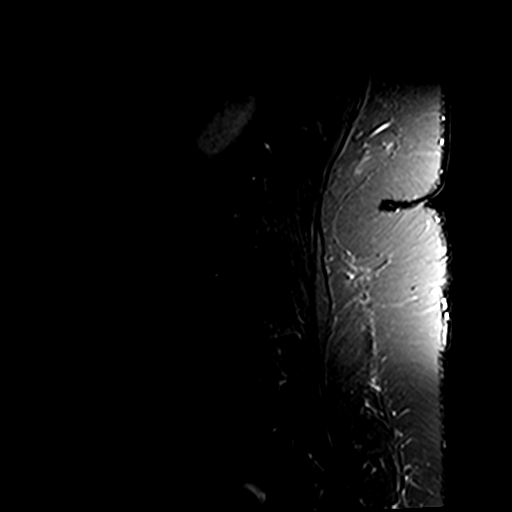
[im 4/15]
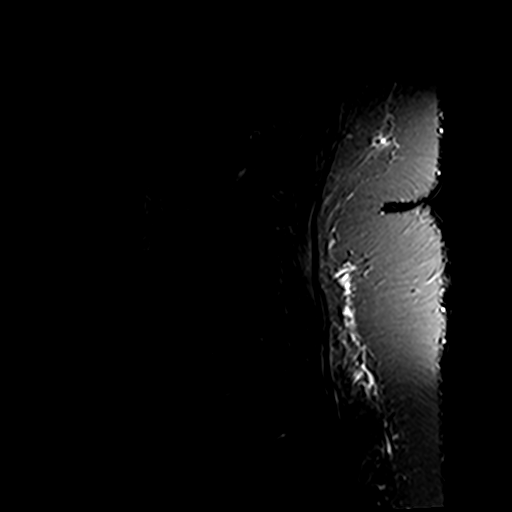
[im 8/15]
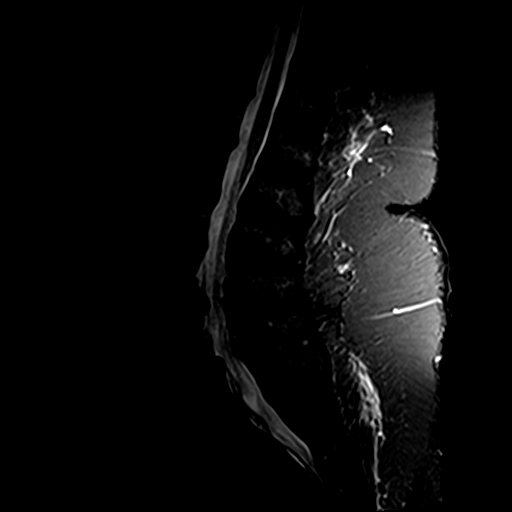
[im 11/15]
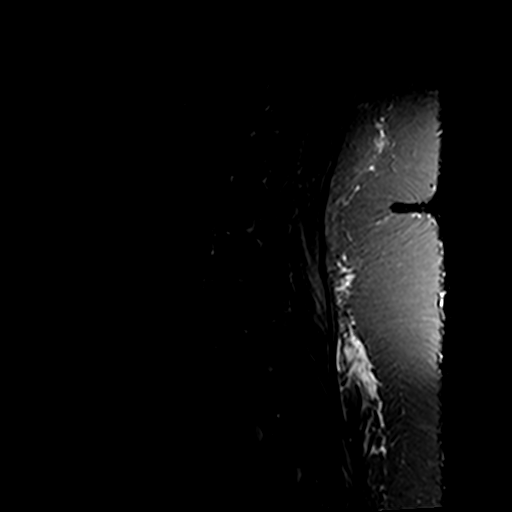
[im 15/15]
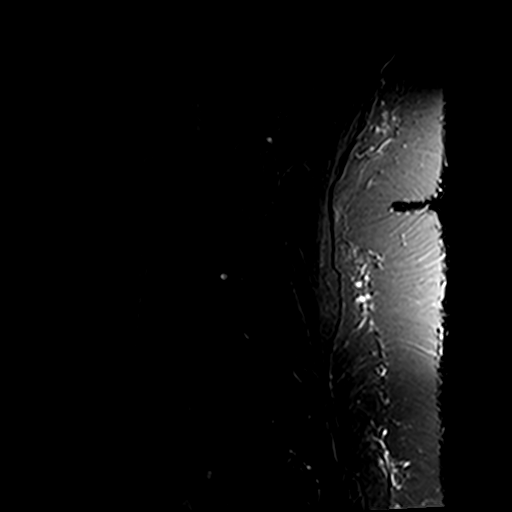

[Series 6: T1 · axial · 4.0mm · 0.78mm/px · z∈[-132,+128]mm · 14 of 51 slices shown (2 of 2)]
[im 1/51]
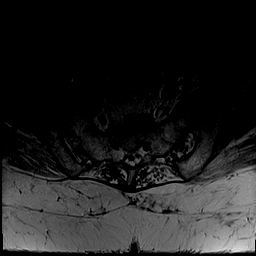
[im 4/51]
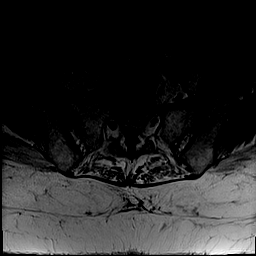
[im 7/51]
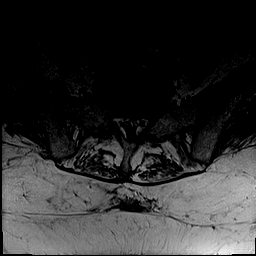
[im 10/51]
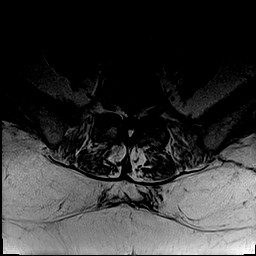
[im 13/51]
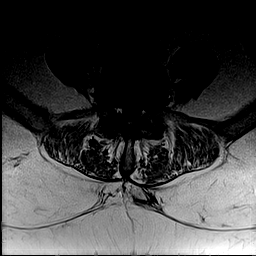
[im 16/51]
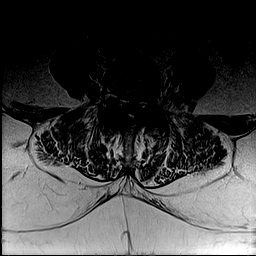
[im 19/51]
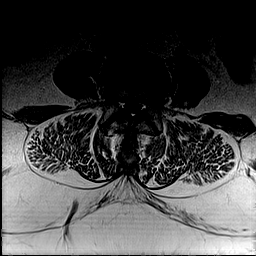
[im 22/51]
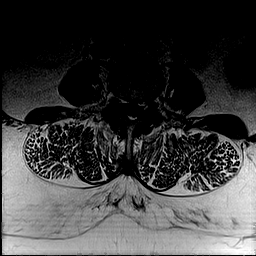
[im 26/51]
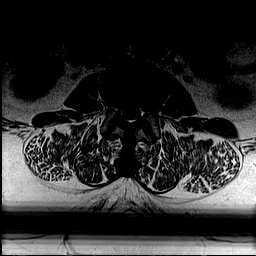
[im 29/51]
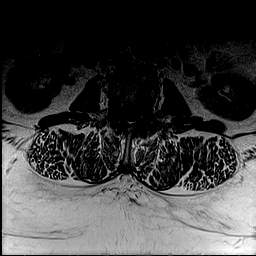
[im 35/51]
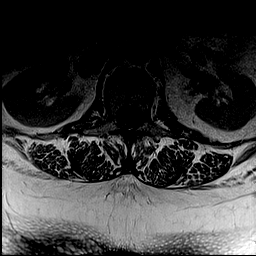
[im 41/51]
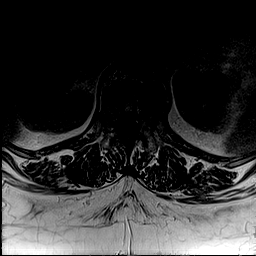
[im 44/51]
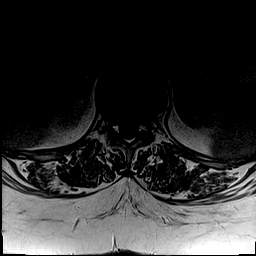
[im 47/51]
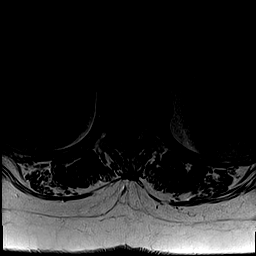

[Series 7: T2 · axial · 4.0mm · 0.78mm/px · z∈[-132,+148]mm · 17 of 51 slices shown]
[im 1/51]
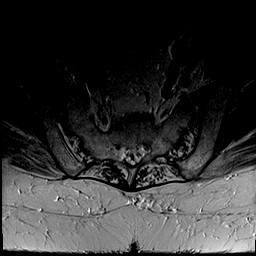
[im 4/51]
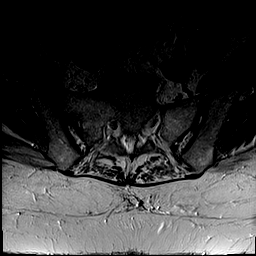
[im 7/51]
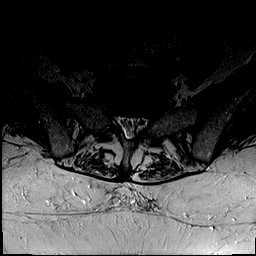
[im 10/51]
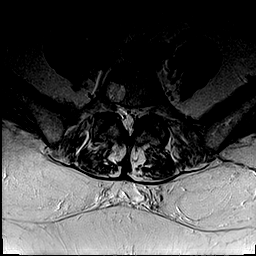
[im 13/51]
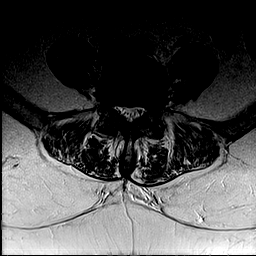
[im 16/51]
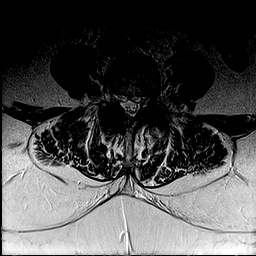
[im 19/51]
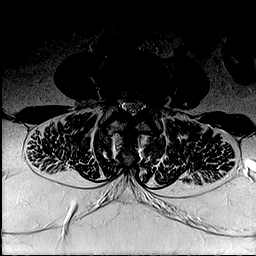
[im 22/51]
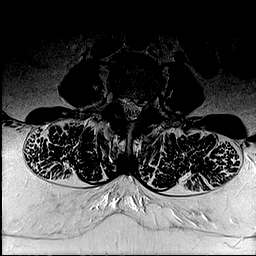
[im 26/51]
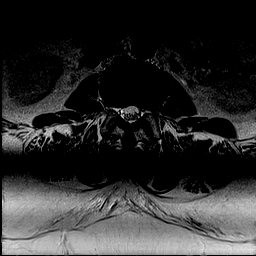
[im 29/51]
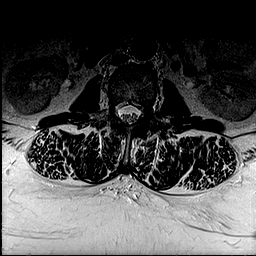
[im 32/51]
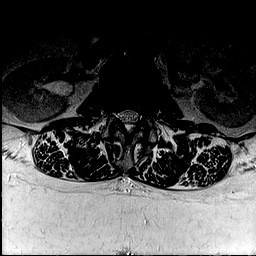
[im 35/51]
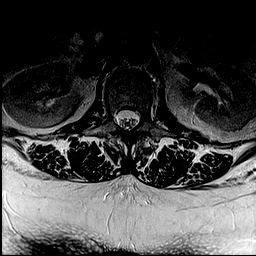
[im 38/51]
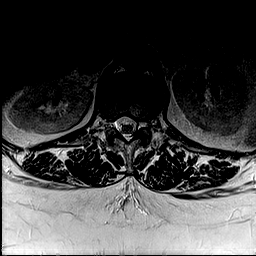
[im 41/51]
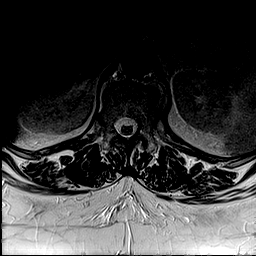
[im 44/51]
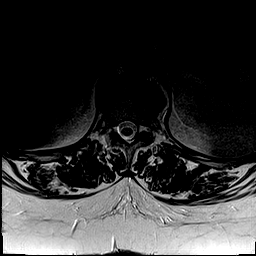
[im 47/51]
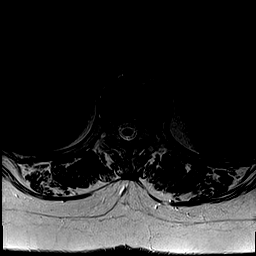
[im 51/51]
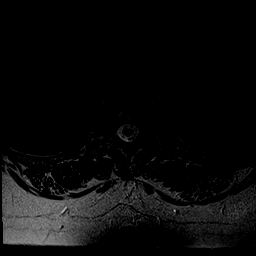

[45 of 48 positions shown; findings below may reference images not displayed]

FINDINGS: Segmentation:  Normal on the comparison radiographs.

Alignment: Stable lumbar lordosis. Grade 1 anterolisthesis of L4 on
L5 measuring 5-6 millimeters. Subtle retrolisthesis of L1 on L2.

Vertebrae: No marrow edema or evidence of acute osseous abnormality.
Visualized bone marrow signal is within normal limits. Benign
hemangioma in the right L5 vertebral body. Intact visible sacrum and
SI joints.

Conus medullaris and cauda equina: Conus extends to the L1 level. No
lower spinal cord or conus signal abnormality.

Paraspinal and other soft tissues: Negative.

Disc levels:

No lower thoracic spinal stenosis despite lower thoracic disc
bulging and mild posterior element hypertrophy.

T12-L1: Mild disc bulge. Borderline to mild left T12 foraminal
stenosis.

L1-L2: Mild circumferential disc bulge and posterior element
hypertrophy. No spinal or lateral recess stenosis. Mild bilateral L1
foraminal stenosis.

L2-L3: Mild foraminal and far lateral disc bulging. Mild to moderate
facet and ligament flavum hypertrophy. No spinal stenosis. Mild
bilateral L2 foraminal stenosis.

L3-L4: Vacuum disc. Circumferential disc bulge with broad-based
posterior component. Mild to moderate facet and ligament flavum
hypertrophy. Mild spinal stenosis without lateral recess stenosis
(series 7, image 32). Mild bilateral L3 foraminal stenosis.

L4-L5: Grade 1 anterolisthesis. Mild circumferential disc
bulge/pseudo disc. Moderate ligament flavum and moderate to severe
facet hypertrophy. Mild spinal stenosis without lateral recess
stenosis. Mild bilateral L4 foraminal stenosis.

L5-S1: Circumferential disc bulge with endplate spurring. Mild facet
hypertrophy. No spinal or lateral recess stenosis. Mild bilateral L5
foraminal stenosis.
IMPRESSION: 1. No acute osseous abnormality. Grade 1 anterolisthesis of L4 on L5
with moderate to severe posterior element degeneration. Mild spinal
and biforaminal stenosis.
2. Relatively advanced L3-L4 disc degeneration and moderate
posterior element degeneration. Mild spinal and biforaminal
stenosis.
3. Mild multifactorial lumbar neural foraminal stenosis elsewhere.

## 2019-05-05 ENCOUNTER — Ambulatory Visit: Payer: Medicaid Other | Admitting: Orthopedic Surgery

## 2019-05-14 ENCOUNTER — Ambulatory Visit: Payer: Medicaid Other | Admitting: Orthopedic Surgery

## 2019-05-26 ENCOUNTER — Ambulatory Visit: Payer: Medicaid Other | Admitting: Orthopedic Surgery

## 2019-06-06 ENCOUNTER — Ambulatory Visit: Payer: Medicaid Other | Admitting: Orthopedic Surgery

## 2019-06-09 ENCOUNTER — Ambulatory Visit: Payer: Medicaid Other | Admitting: Orthopedic Surgery

## 2019-06-25 ENCOUNTER — Ambulatory Visit: Payer: Medicaid Other | Admitting: Orthopedic Surgery

## 2019-07-17 ENCOUNTER — Ambulatory Visit: Payer: Medicaid Other | Admitting: Orthopedic Surgery

## 2019-07-28 ENCOUNTER — Ambulatory Visit: Payer: Medicaid Other | Admitting: Orthopedic Surgery

## 2019-08-13 ENCOUNTER — Ambulatory Visit: Payer: Medicaid Other | Admitting: Orthopedic Surgery

## 2019-12-24 ENCOUNTER — Ambulatory Visit: Payer: Medicaid Other | Admitting: Orthopedic Surgery

## 2020-01-08 ENCOUNTER — Ambulatory Visit: Payer: Medicaid Other | Admitting: Orthopedic Surgery

## 2020-01-12 ENCOUNTER — Ambulatory Visit: Payer: Medicaid Other | Admitting: Orthopedic Surgery

## 2020-01-26 ENCOUNTER — Ambulatory Visit: Payer: Medicaid Other | Admitting: Orthopedic Surgery

## 2020-05-03 ENCOUNTER — Ambulatory Visit: Payer: Medicaid Other | Admitting: Orthopedic Surgery

## 2020-05-14 ENCOUNTER — Ambulatory Visit: Payer: Medicaid Other | Admitting: Orthopedic Surgery

## 2020-05-24 ENCOUNTER — Ambulatory Visit: Payer: Medicaid Other | Admitting: Orthopedic Surgery
